# Patient Record
Sex: Female | Born: 1960
Health system: Southern US, Community
[De-identification: ages and names within clinical notes are randomized; demographics above are authoritative.]

## PROBLEM LIST (undated history)

## (undated) DIAGNOSIS — E119 Type 2 diabetes mellitus without complications: Secondary | ICD-10-CM

## (undated) DIAGNOSIS — I10 Essential (primary) hypertension: Secondary | ICD-10-CM

## (undated) DIAGNOSIS — I509 Heart failure, unspecified: Secondary | ICD-10-CM

## (undated) HISTORY — PX: CHOLECYSTECTOMY: SHX55

---

## 2016-11-08 ENCOUNTER — Inpatient Hospital Stay (HOSPITAL_COMMUNITY)
Admission: EM | Admit: 2016-11-08 | Discharge: 2016-11-13 | DRG: 291 | Disposition: A | Discharge status: Home / Self Care | Payer: Medicaid - Out of State | Attending: Internal Medicine | Admitting: Internal Medicine

## 2016-11-08 ENCOUNTER — Encounter (HOSPITAL_COMMUNITY): Payer: Self-pay | Admitting: Emergency Medicine

## 2016-11-08 ENCOUNTER — Emergency Department (HOSPITAL_COMMUNITY): Payer: Medicaid - Out of State

## 2016-11-08 DIAGNOSIS — Z7982 Long term (current) use of aspirin: Secondary | ICD-10-CM

## 2016-11-08 DIAGNOSIS — E669 Obesity, unspecified: Secondary | ICD-10-CM | POA: Diagnosis present

## 2016-11-08 DIAGNOSIS — I10 Essential (primary) hypertension: Secondary | ICD-10-CM | POA: Diagnosis present

## 2016-11-08 DIAGNOSIS — E1121 Type 2 diabetes mellitus with diabetic nephropathy: Secondary | ICD-10-CM | POA: Diagnosis present

## 2016-11-08 DIAGNOSIS — N183 Chronic kidney disease, stage 3 (moderate): Secondary | ICD-10-CM | POA: Diagnosis present

## 2016-11-08 DIAGNOSIS — I5021 Acute systolic (congestive) heart failure: Secondary | ICD-10-CM

## 2016-11-08 DIAGNOSIS — E119 Type 2 diabetes mellitus without complications: Secondary | ICD-10-CM | POA: Diagnosis present

## 2016-11-08 DIAGNOSIS — D696 Thrombocytopenia, unspecified: Secondary | ICD-10-CM | POA: Diagnosis present

## 2016-11-08 DIAGNOSIS — E785 Hyperlipidemia, unspecified: Secondary | ICD-10-CM | POA: Diagnosis present

## 2016-11-08 DIAGNOSIS — D638 Anemia in other chronic diseases classified elsewhere: Secondary | ICD-10-CM | POA: Diagnosis present

## 2016-11-08 DIAGNOSIS — D509 Iron deficiency anemia, unspecified: Secondary | ICD-10-CM | POA: Diagnosis present

## 2016-11-08 DIAGNOSIS — I130 Hypertensive heart and chronic kidney disease with heart failure and stage 1 through stage 4 chronic kidney disease, or unspecified chronic kidney disease: Principal | ICD-10-CM | POA: Diagnosis present

## 2016-11-08 DIAGNOSIS — Z794 Long term (current) use of insulin: Secondary | ICD-10-CM

## 2016-11-08 DIAGNOSIS — I5043 Acute on chronic combined systolic (congestive) and diastolic (congestive) heart failure: Secondary | ICD-10-CM | POA: Diagnosis present

## 2016-11-08 DIAGNOSIS — R0902 Hypoxemia: Secondary | ICD-10-CM

## 2016-11-08 DIAGNOSIS — E0590 Thyrotoxicosis, unspecified without thyrotoxic crisis or storm: Secondary | ICD-10-CM | POA: Diagnosis present

## 2016-11-08 DIAGNOSIS — Z6833 Body mass index (BMI) 33.0-33.9, adult: Secondary | ICD-10-CM

## 2016-11-08 DIAGNOSIS — Z8249 Family history of ischemic heart disease and other diseases of the circulatory system: Secondary | ICD-10-CM

## 2016-11-08 DIAGNOSIS — Z833 Family history of diabetes mellitus: Secondary | ICD-10-CM

## 2016-11-08 DIAGNOSIS — N179 Acute kidney failure, unspecified: Secondary | ICD-10-CM | POA: Diagnosis present

## 2016-11-08 DIAGNOSIS — D649 Anemia, unspecified: Secondary | ICD-10-CM | POA: Diagnosis present

## 2016-11-08 DIAGNOSIS — N184 Chronic kidney disease, stage 4 (severe): Secondary | ICD-10-CM | POA: Diagnosis present

## 2016-11-08 DIAGNOSIS — E1122 Type 2 diabetes mellitus with diabetic chronic kidney disease: Secondary | ICD-10-CM | POA: Diagnosis present

## 2016-11-08 DIAGNOSIS — I5023 Acute on chronic systolic (congestive) heart failure: Secondary | ICD-10-CM

## 2016-11-08 DIAGNOSIS — J9601 Acute respiratory failure with hypoxia: Secondary | ICD-10-CM | POA: Diagnosis present

## 2016-11-08 DIAGNOSIS — I248 Other forms of acute ischemic heart disease: Secondary | ICD-10-CM | POA: Diagnosis present

## 2016-11-08 HISTORY — DX: Type 2 diabetes mellitus without complications: E11.9

## 2016-11-08 HISTORY — DX: Heart failure, unspecified: I50.9

## 2016-11-08 HISTORY — DX: Essential (primary) hypertension: I10

## 2016-11-08 LAB — COMPREHENSIVE METABOLIC PANEL
ALBUMIN: 3.2 g/dL — AB (ref 3.5–5.0)
ALT: 35 U/L (ref 14–54)
ANION GAP: 3 — AB (ref 5–15)
AST: 28 U/L (ref 15–41)
Alkaline Phosphatase: 94 U/L (ref 38–126)
BILIRUBIN TOTAL: 1.1 mg/dL (ref 0.3–1.2)
BUN: 36 mg/dL — AB (ref 6–20)
CHLORIDE: 115 mmol/L — AB (ref 101–111)
CO2: 26 mmol/L (ref 22–32)
Calcium: 8.6 mg/dL — ABNORMAL LOW (ref 8.9–10.3)
Creatinine, Ser: 1.68 mg/dL — ABNORMAL HIGH (ref 0.44–1.00)
GFR calc Af Amer: 38 mL/min — ABNORMAL LOW (ref >60)
GFR calc non Af Amer: 33 mL/min — ABNORMAL LOW (ref >60)
GLUCOSE: 184 mg/dL — AB (ref 65–99)
POTASSIUM: 3.5 mmol/L (ref 3.5–5.1)
SODIUM: 144 mmol/L (ref 135–145)
Total Protein: 6.3 g/dL — ABNORMAL LOW (ref 6.5–8.1)

## 2016-11-08 LAB — CBC WITH DIFFERENTIAL/PLATELET
BASOS ABS: 0 10*3/uL (ref 0.0–0.1)
Basophils Relative: 0 %
EOS PCT: 1 %
Eosinophils Absolute: 0.1 10*3/uL (ref 0.0–0.7)
HEMATOCRIT: 26.5 % — AB (ref 36.0–46.0)
Hemoglobin: 8.7 g/dL — ABNORMAL LOW (ref 12.0–15.0)
LYMPHS ABS: 1 10*3/uL (ref 0.7–4.0)
LYMPHS PCT: 11 %
MCH: 28.4 pg (ref 26.0–34.0)
MCHC: 32.8 g/dL (ref 30.0–36.0)
MCV: 86.6 fL (ref 78.0–100.0)
MONO ABS: 0.5 10*3/uL (ref 0.1–1.0)
MONOS PCT: 6 %
NEUTROS ABS: 7.1 10*3/uL (ref 1.7–7.7)
Neutrophils Relative %: 82 %
PLATELETS: 87 10*3/uL — AB (ref 150–400)
RBC: 3.06 MIL/uL — ABNORMAL LOW (ref 3.87–5.11)
RDW: 17.8 % — AB (ref 11.5–15.5)
WBC: 8.7 10*3/uL (ref 4.0–10.5)

## 2016-11-08 LAB — I-STAT CG4 LACTIC ACID, ED: Lactic Acid, Venous: 0.91 mmol/L (ref 0.5–1.9)

## 2016-11-08 NOTE — ED Provider Notes (Signed)
TIME SEEN: 11:13 PM  CHIEF COMPLAINT: shortness of breath  HPI: patient is a 56 year old female with history of hypertension, diabetes, CHF on Lasix 40 mg twice daily was visiting her son and is from [place] who presents to the emergency department with complaints f shortness of breath for the past couple of days in bilateral lower extreme swelling. Having diffuse chest tightness. No fevers but has had dry cough. No history of PE or DVT. She denies history of asthma or COPD. She is not a smoker. She does have a nebulizer machine at home however.  ROS: See HPI Constitutional: no fever  Eyes: no drainage  ENT: no runny nose   Cardiovascular:   chest pain  Resp:  SOB  GI: no vomiting GU: no dysuria Integumentary: no rash  Allergy: no hives  Musculoskeletal: no leg swelling  Neurological: no slurred speech ROS otherwise negative  PAST MEDICAL HISTORY/PAST SURGICAL HISTORY:  Past Medical History:  Diagnosis Date  . CHF (congestive heart failure) (HCC)   . Diabetes mellitus without complication (HCC)   . Hypertension     MEDICATIONS:  Prior to Admission medications   Not on File    ALLERGIES:  No Known Allergies  SOCIAL HISTORY:  Social History  Substance Use Topics  . Smoking status: Never Smoker  . Smokeless tobacco: Never Used  . Alcohol use No    FAMILY HISTORY: History reviewed. No pertinent family history.  EXAM: BP (!) 192/100 (BP Location: Right Arm)   Pulse (!) 111   Temp 98.1 F (36.7 C) (Oral)   Resp 10   Ht 5\' 7"  (1.702 m)   Wt 97.5 kg (215 lb)   SpO2 97%   BMI 33.67 kg/m  CONSTITUTIONAL: Alert and oriented and responds appropriately to questions. Well-appearing; well-nourished HEAD: Normocephalic EYES: Conjunctivae clear, pupils appear equal, EOMI ENT: normal nose; moist mucous membranes NECK: Supple, no meningismus, no nuchal rigidity, no LAD  CARD: RRR; S1 and S2 appreciated; no murmurs, no clicks, no rubs, no gallops RESP: patient is  hypoxic on room air with sats of 87%, mild bibasilar crackles, no rhonchi or wheezing, no respiratory distress, speaking full sentences, patient is tachypneic ABD/GI: Normal bowel sounds; non-distended; soft, non-tender, no rebound, no guarding, no peritoneal signs, no hepatosplenomegaly BACK:  The back appears normal and is non-tender to palpation, there is no CVA tenderness EXT: Normal ROM in all joints; non-tender to palpation; trace pitting edema in bilateral lower extremities; normal capillary refill; no cyanosis, no calf tenderness or swelling    SKIN: Normal color for age and race; warm; no rash NEURO: Moves all extremities equally PSYCH: The patient's mood and manner are appropriate. Grooming and personal hygiene are appropriate.  MEDICAL DECISION MAKING: patient here with shortness of breath. Has history of CHF. Does appear slightly volume overloaded.we'll obtain labs. She does feel warm to touch but rectal temperature is normal.  ED PROGRESS: patient appears to have a CHF exacerbation. BNP is elevated and chest x-ray consistent with pulmonary edema. Troponin negative. We'll give 40 mg of IV Lasix for diuresis. I recommended admission for IV diuresis especially given new oxygen requirement. Will discuss with medicine. Her primary care provider is in [place].    12:25 AM Discussed patient's case with hospitalist, Dr. Adela Glimpse.  I have recommended admission and patient (and family if present) agree with this plan. Admitting physician will place admission orders.   I reviewed all nursing notes, vitals, pertinent previous records, EKGs, lab and urine results, imaging (  as available).     Mayu Ronk, Layla Maw, DO 11/09/16 0116

## 2016-11-08 NOTE — ED Triage Notes (Signed)
Pt presents by EMS from home for evaluation of shortness of breath that has been ongoing for the last 2 days. Pt reports hx of CHF and last dose of Lasix was this morning. Pt reports prior hx of intubation related to respiratory failure.

## 2016-11-08 NOTE — ED Notes (Signed)
Bed: RESA Expected date:  Expected time:  Means of arrival:  Comments: 56 yr old respiratory distress

## 2016-11-09 ENCOUNTER — Inpatient Hospital Stay (HOSPITAL_COMMUNITY): Payer: Medicaid - Out of State

## 2016-11-09 ENCOUNTER — Encounter (HOSPITAL_COMMUNITY): Payer: Self-pay | Admitting: Internal Medicine

## 2016-11-09 ENCOUNTER — Inpatient Hospital Stay (HOSPITAL_COMMUNITY)
Admit: 2016-11-09 | Discharge: 2016-11-09 | Disposition: A | Discharge status: Home / Self Care | Payer: Medicaid - Out of State | Attending: Internal Medicine | Admitting: Internal Medicine

## 2016-11-09 DIAGNOSIS — E119 Type 2 diabetes mellitus without complications: Secondary | ICD-10-CM | POA: Diagnosis present

## 2016-11-09 DIAGNOSIS — I10 Essential (primary) hypertension: Secondary | ICD-10-CM | POA: Diagnosis not present

## 2016-11-09 DIAGNOSIS — J9601 Acute respiratory failure with hypoxia: Secondary | ICD-10-CM

## 2016-11-09 DIAGNOSIS — E1121 Type 2 diabetes mellitus with diabetic nephropathy: Secondary | ICD-10-CM | POA: Diagnosis present

## 2016-11-09 DIAGNOSIS — I5031 Acute diastolic (congestive) heart failure: Secondary | ICD-10-CM | POA: Diagnosis not present

## 2016-11-09 DIAGNOSIS — N184 Chronic kidney disease, stage 4 (severe): Secondary | ICD-10-CM | POA: Diagnosis present

## 2016-11-09 DIAGNOSIS — E1122 Type 2 diabetes mellitus with diabetic chronic kidney disease: Secondary | ICD-10-CM | POA: Diagnosis present

## 2016-11-09 DIAGNOSIS — I5043 Acute on chronic combined systolic (congestive) and diastolic (congestive) heart failure: Secondary | ICD-10-CM | POA: Diagnosis present

## 2016-11-09 DIAGNOSIS — N183 Chronic kidney disease, stage 3 (moderate): Secondary | ICD-10-CM

## 2016-11-09 DIAGNOSIS — I361 Nonrheumatic tricuspid (valve) insufficiency: Secondary | ICD-10-CM | POA: Diagnosis not present

## 2016-11-09 DIAGNOSIS — I5033 Acute on chronic diastolic (congestive) heart failure: Secondary | ICD-10-CM

## 2016-11-09 DIAGNOSIS — Z794 Long term (current) use of insulin: Secondary | ICD-10-CM

## 2016-11-09 DIAGNOSIS — R609 Edema, unspecified: Secondary | ICD-10-CM

## 2016-11-09 DIAGNOSIS — N179 Acute kidney failure, unspecified: Secondary | ICD-10-CM

## 2016-11-09 DIAGNOSIS — D696 Thrombocytopenia, unspecified: Secondary | ICD-10-CM | POA: Diagnosis present

## 2016-11-09 DIAGNOSIS — Z7982 Long term (current) use of aspirin: Secondary | ICD-10-CM | POA: Diagnosis not present

## 2016-11-09 DIAGNOSIS — R0602 Shortness of breath: Secondary | ICD-10-CM | POA: Diagnosis present

## 2016-11-09 DIAGNOSIS — D638 Anemia in other chronic diseases classified elsewhere: Secondary | ICD-10-CM | POA: Diagnosis present

## 2016-11-09 DIAGNOSIS — I5023 Acute on chronic systolic (congestive) heart failure: Secondary | ICD-10-CM

## 2016-11-09 DIAGNOSIS — E669 Obesity, unspecified: Secondary | ICD-10-CM | POA: Diagnosis present

## 2016-11-09 DIAGNOSIS — I130 Hypertensive heart and chronic kidney disease with heart failure and stage 1 through stage 4 chronic kidney disease, or unspecified chronic kidney disease: Secondary | ICD-10-CM | POA: Diagnosis present

## 2016-11-09 DIAGNOSIS — Z6833 Body mass index (BMI) 33.0-33.9, adult: Secondary | ICD-10-CM | POA: Diagnosis not present

## 2016-11-09 DIAGNOSIS — E785 Hyperlipidemia, unspecified: Secondary | ICD-10-CM | POA: Diagnosis present

## 2016-11-09 DIAGNOSIS — E0590 Thyrotoxicosis, unspecified without thyrotoxic crisis or storm: Secondary | ICD-10-CM | POA: Diagnosis present

## 2016-11-09 DIAGNOSIS — D509 Iron deficiency anemia, unspecified: Secondary | ICD-10-CM | POA: Diagnosis present

## 2016-11-09 DIAGNOSIS — Z8249 Family history of ischemic heart disease and other diseases of the circulatory system: Secondary | ICD-10-CM | POA: Diagnosis not present

## 2016-11-09 DIAGNOSIS — D649 Anemia, unspecified: Secondary | ICD-10-CM | POA: Diagnosis not present

## 2016-11-09 DIAGNOSIS — Z833 Family history of diabetes mellitus: Secondary | ICD-10-CM | POA: Diagnosis not present

## 2016-11-09 DIAGNOSIS — D500 Iron deficiency anemia secondary to blood loss (chronic): Secondary | ICD-10-CM | POA: Diagnosis not present

## 2016-11-09 DIAGNOSIS — I248 Other forms of acute ischemic heart disease: Secondary | ICD-10-CM | POA: Diagnosis present

## 2016-11-09 LAB — TROPONIN I
TROPONIN I: 0.05 ng/mL — AB (ref <0.03)
TROPONIN I: 0.04 ng/mL — AB (ref <0.03)
Troponin I: 0.04 ng/mL (ref <0.03)

## 2016-11-09 LAB — COMPREHENSIVE METABOLIC PANEL
ALBUMIN: 3.3 g/dL — AB (ref 3.5–5.0)
ALK PHOS: 91 U/L (ref 38–126)
ALT: 32 U/L (ref 14–54)
ANION GAP: 6 (ref 5–15)
AST: 20 U/L (ref 15–41)
BUN: 36 mg/dL — ABNORMAL HIGH (ref 6–20)
CALCIUM: 8.8 mg/dL — AB (ref 8.9–10.3)
CHLORIDE: 113 mmol/L — AB (ref 101–111)
CO2: 26 mmol/L (ref 22–32)
Creatinine, Ser: 1.68 mg/dL — ABNORMAL HIGH (ref 0.44–1.00)
GFR calc non Af Amer: 33 mL/min — ABNORMAL LOW (ref >60)
GFR, EST AFRICAN AMERICAN: 38 mL/min — AB (ref >60)
GLUCOSE: 136 mg/dL — AB (ref 65–99)
Potassium: 3.7 mmol/L (ref 3.5–5.1)
SODIUM: 145 mmol/L (ref 135–145)
Total Bilirubin: 1 mg/dL (ref 0.3–1.2)
Total Protein: 6.2 g/dL — ABNORMAL LOW (ref 6.5–8.1)

## 2016-11-09 LAB — URINALYSIS, ROUTINE W REFLEX MICROSCOPIC
Bacteria, UA: NONE SEEN
Bilirubin Urine: NEGATIVE
Glucose, UA: NEGATIVE mg/dL
KETONES UR: NEGATIVE mg/dL
Nitrite: NEGATIVE
PH: 5.5 (ref 5.0–8.0)
Protein, ur: 300 mg/dL — AB
Specific Gravity, Urine: 1.02 (ref 1.005–1.030)

## 2016-11-09 LAB — CBC
HCT: 25.8 % — ABNORMAL LOW (ref 36.0–46.0)
HEMOGLOBIN: 8.4 g/dL — AB (ref 12.0–15.0)
MCH: 28.3 pg (ref 26.0–34.0)
MCHC: 32.6 g/dL (ref 30.0–36.0)
MCV: 86.9 fL (ref 78.0–100.0)
Platelets: 97 10*3/uL — ABNORMAL LOW (ref 150–400)
RBC: 2.97 MIL/uL — AB (ref 3.87–5.11)
RDW: 17.8 % — ABNORMAL HIGH (ref 11.5–15.5)
WBC: 8.5 10*3/uL (ref 4.0–10.5)

## 2016-11-09 LAB — D-DIMER, QUANTITATIVE (NOT AT ARMC): D DIMER QUANT: 2.99 ug{FEU}/mL — AB (ref 0.00–0.50)

## 2016-11-09 LAB — GLUCOSE, CAPILLARY
GLUCOSE-CAPILLARY: 126 mg/dL — AB (ref 65–99)
GLUCOSE-CAPILLARY: 175 mg/dL — AB (ref 65–99)

## 2016-11-09 LAB — IRON AND TIBC
IRON: 25 ug/dL — AB (ref 28–170)
Saturation Ratios: 12 % (ref 10.4–31.8)
TIBC: 211 ug/dL — AB (ref 250–450)
UIBC: 186 ug/dL

## 2016-11-09 LAB — CREATININE, URINE, RANDOM: CREATININE, URINE: 105.96 mg/dL

## 2016-11-09 LAB — BRAIN NATRIURETIC PEPTIDE: B Natriuretic Peptide: 814 pg/mL — ABNORMAL HIGH (ref 0.0–100.0)

## 2016-11-09 LAB — TSH: TSH: 0.019 u[IU]/mL — AB (ref 0.350–4.500)

## 2016-11-09 LAB — CBG MONITORING, ED
Glucose-Capillary: 131 mg/dL — ABNORMAL HIGH (ref 65–99)
Glucose-Capillary: 130 mg/dL — ABNORMAL HIGH (ref 65–99)

## 2016-11-09 LAB — MAGNESIUM: MAGNESIUM: 1.8 mg/dL (ref 1.7–2.4)

## 2016-11-09 LAB — FERRITIN: Ferritin: 97 ng/mL (ref 11–307)

## 2016-11-09 LAB — SODIUM, URINE, RANDOM: SODIUM UR: 68 mmol/L

## 2016-11-09 LAB — T4, FREE: FREE T4: 1.24 ng/dL — AB (ref 0.61–1.12)

## 2016-11-09 LAB — RETICULOCYTES
RBC.: 2.97 MIL/uL — ABNORMAL LOW (ref 3.87–5.11)
RETIC COUNT ABSOLUTE: 83.2 10*3/uL (ref 19.0–186.0)
RETIC CT PCT: 2.8 % (ref 0.4–3.1)

## 2016-11-09 LAB — I-STAT TROPONIN, ED: TROPONIN I, POC: 0.04 ng/mL (ref 0.00–0.08)

## 2016-11-09 LAB — HIV ANTIBODY (ROUTINE TESTING W REFLEX): HIV Screen 4th Generation wRfx: NONREACTIVE

## 2016-11-09 LAB — VITAMIN B12: VITAMIN B 12: 653 pg/mL (ref 180–914)

## 2016-11-09 LAB — PHOSPHORUS: Phosphorus: 2.8 mg/dL (ref 2.5–4.6)

## 2016-11-09 LAB — FOLATE: Folate: 15.8 ng/mL (ref >5.9)

## 2016-11-09 MED ORDER — TECHNETIUM TC 99M DIETHYLENETRIAME-PENTAACETIC ACID
32.6000 | Freq: Once | INTRAVENOUS | Status: AC | PRN
  Administered 2016-11-09: 32.6 via INTRAVENOUS

## 2016-11-09 MED ORDER — PERFLUTREN LIPID MICROSPHERE
1.0000 mL | INTRAVENOUS | Status: AC | PRN
  Administered 2016-11-09: 3 mL via INTRAVENOUS
  Filled 2016-11-09: qty 10

## 2016-11-09 MED ORDER — ACETAMINOPHEN 325 MG PO TABS: 650.0000 mg | ORAL_TABLET | Freq: Four times a day (QID) | ORAL | Status: DC | PRN

## 2016-11-09 MED ORDER — ISOSORB DINITRATE-HYDRALAZINE 20-37.5 MG PO TABS
1.0000 | ORAL_TABLET | Freq: Three times a day (TID) | ORAL | Status: DC
  Administered 2016-11-09 – 2016-11-13 (×12): 1 via ORAL
  Filled 2016-11-09 (×13): qty 1

## 2016-11-09 MED ORDER — CARVEDILOL 25 MG PO TABS
50.0000 mg | ORAL_TABLET | Freq: Two times a day (BID) | ORAL | Status: DC
  Administered 2016-11-09 – 2016-11-13 (×9): 50 mg via ORAL
  Filled 2016-11-09 (×10): qty 2

## 2016-11-09 MED ORDER — PERFLUTREN LIPID MICROSPHERE
INTRAVENOUS | Status: AC
  Filled 2016-11-09: qty 10

## 2016-11-09 MED ORDER — SODIUM CHLORIDE 0.9% FLUSH
3.0000 mL | Freq: Two times a day (BID) | INTRAVENOUS | Status: DC
  Administered 2016-11-09 – 2016-11-13 (×9): 3 mL via INTRAVENOUS

## 2016-11-09 MED ORDER — ATORVASTATIN CALCIUM 40 MG PO TABS
40.0000 mg | ORAL_TABLET | Freq: Every day | ORAL | Status: DC
  Administered 2016-11-09 – 2016-11-13 (×5): 40 mg via ORAL
  Filled 2016-11-09 (×5): qty 1

## 2016-11-09 MED ORDER — ACETAMINOPHEN 650 MG RE SUPP: 650.0000 mg | Freq: Four times a day (QID) | RECTAL | Status: DC | PRN

## 2016-11-09 MED ORDER — LEVOTHYROXINE SODIUM 25 MCG PO TABS
25.0000 ug | ORAL_TABLET | Freq: Every day | ORAL | Status: DC
  Administered 2016-11-10: 25 ug via ORAL
  Filled 2016-11-09: qty 1

## 2016-11-09 MED ORDER — FUROSEMIDE 10 MG/ML IJ SOLN
40.0000 mg | Freq: Two times a day (BID) | INTRAMUSCULAR | Status: DC
  Administered 2016-11-10 – 2016-11-11 (×3): 40 mg via INTRAVENOUS
  Filled 2016-11-09 (×3): qty 4

## 2016-11-09 MED ORDER — SODIUM CHLORIDE 0.9 % IV SOLN: 250.0000 mL | INTRAVENOUS | Status: DC | PRN

## 2016-11-09 MED ORDER — INSULIN ASPART 100 UNIT/ML ~~LOC~~ SOLN
0.0000 [IU] | Freq: Three times a day (TID) | SUBCUTANEOUS | Status: DC
  Administered 2016-11-09 – 2016-11-10 (×4): 1 [IU] via SUBCUTANEOUS
  Administered 2016-11-11 (×2): 2 [IU] via SUBCUTANEOUS
  Administered 2016-11-12 (×3): 1 [IU] via SUBCUTANEOUS
  Administered 2016-11-13: 3 [IU] via SUBCUTANEOUS

## 2016-11-09 MED ORDER — SODIUM CHLORIDE 0.9% FLUSH: 3.0000 mL | INTRAVENOUS | Status: DC | PRN

## 2016-11-09 MED ORDER — INSULIN GLARGINE 100 UNIT/ML ~~LOC~~ SOLN
25.0000 [IU] | Freq: Every day | SUBCUTANEOUS | Status: DC
  Administered 2016-11-09 – 2016-11-12 (×4): 25 [IU] via SUBCUTANEOUS
  Filled 2016-11-09 (×5): qty 0.25

## 2016-11-09 MED ORDER — TECHNETIUM TO 99M ALBUMIN AGGREGATED
4.2000 | Freq: Once | INTRAVENOUS | Status: AC | PRN
  Administered 2016-11-09: 4.2 via INTRAVENOUS

## 2016-11-09 MED ORDER — ALLOPURINOL 100 MG PO TABS
100.0000 mg | ORAL_TABLET | Freq: Every day | ORAL | Status: DC
  Administered 2016-11-09 – 2016-11-13 (×5): 100 mg via ORAL
  Filled 2016-11-09 (×5): qty 1

## 2016-11-09 MED ORDER — ONDANSETRON HCL 4 MG PO TABS: 4.0000 mg | ORAL_TABLET | Freq: Four times a day (QID) | ORAL | Status: DC | PRN

## 2016-11-09 MED ORDER — FERROUS SULFATE 325 (65 FE) MG PO TABS
325.0000 mg | ORAL_TABLET | Freq: Two times a day (BID) | ORAL | Status: DC
  Administered 2016-11-09 – 2016-11-13 (×8): 325 mg via ORAL
  Filled 2016-11-09 (×8): qty 1

## 2016-11-09 MED ORDER — MOMETASONE FURO-FORMOTEROL FUM 200-5 MCG/ACT IN AERO
2.0000 | INHALATION_SPRAY | Freq: Two times a day (BID) | RESPIRATORY_TRACT | Status: DC
  Administered 2016-11-09 – 2016-11-13 (×8): 2 via RESPIRATORY_TRACT
  Filled 2016-11-09: qty 8.8

## 2016-11-09 MED ORDER — INSULIN ASPART 100 UNIT/ML ~~LOC~~ SOLN
0.0000 [IU] | Freq: Every day | SUBCUTANEOUS | Status: DC
  Administered 2016-11-10: 2 [IU] via SUBCUTANEOUS

## 2016-11-09 MED ORDER — FUROSEMIDE 10 MG/ML IJ SOLN
40.0000 mg | Freq: Once | INTRAMUSCULAR | Status: AC
  Administered 2016-11-09: 40 mg via INTRAVENOUS
  Filled 2016-11-09: qty 4

## 2016-11-09 MED ORDER — ONDANSETRON HCL 4 MG/2ML IJ SOLN: 4.0000 mg | Freq: Four times a day (QID) | INTRAMUSCULAR | Status: DC | PRN

## 2016-11-09 NOTE — Consult Note (Signed)
Cardiology Consultation:   Patient ID: Barbara Velazquez; [id number]; 11/17/1960   Admit date: 11/08/2016 Date of Consult: 11/09/2016  Primary Care Provider: System, Pcp Not In Primary Cardiologist: NEW  Dr. Eden Emms on [facility] Primary Electrophysiologist:  NA   Patient Profile:   Barbara Velazquez is a 56 y.o. female with a hx of CHF, with hx of intubation for respiratory failure, DM-2, HTN who is being seen today for the evaluation of CHF at the request of Dr. Edward Jolly.  History of Present Illness:   Barbara Velazquez  a hx of CHF, with hx of intubation for respiratory failure, DM-2, HTN treated in [place] with recent intubation in March of this year.  Also anemic then.    Was admitted early AM today for acute SOB Dx of acute respiratory failure with hypoxia.  DDimer was elevated and VQ has been ordered.    CXR with cardiomegaly with sm. Posterior pl. Effusions and diffuse airspace disease. CHF.  EKG ST at 104 Q waves in V1-2 and V5-6 and lead I with T wave inversions, no old to compare. I personally reviewed TELE SR  BNP 814  Today Na 145, K+3.7, Cl 113, BUN 36 and Cr 1.68 similar to admit.   WBC 8.5, Hgb 8.4, platelets 97 again similar to admit  Troponin 0.04 and 0.05 DDimer 2.99 awaiting VQ,  TSH 0.019   Iron at 25   BP on admit  184/88, 191/101, now 185/94  Respiration still elevated at 25.   Echo pending Was on lasix 40 mg po daily, hydralazine, and statin, ASA.and ARB and BB.   Currently feeling better, just back from VQ scan.  She had some chest pressure with her SOB.   She tells me she had cardiac cath 5-6 years ago and everything was fine no blockages.   She has had HF for a long time and believes it is diastolic HF.  When she was in the hospital in March/April in [facility] they discussed cardiac cath but decided not to do.  She had been doing well until last 3 days or so.  She is living with her son in [place] and he and his wife have been following a diet for her.  But she  had increased SOB and some mild swelling in her feet last Pm and SOB was worse.   She has received IV lasix and has voided more freq.    Neg doppler for DVT.    Past Medical History:  Diagnosis Date  . CHF (congestive heart failure) (HCC)   . Diabetes mellitus without complication (HCC)   . Hypertension     Past Surgical History:  Procedure Laterality Date  . CHOLECYSTECTOMY       Home Medications:  Prior to Admission medications   Medication Sig Start Date End Date Taking? Authorizing Provider  ADVAIR DISKUS 250-50 MCG/DOSE AEPB Inhale 1 puff into the lungs 2 (two) times daily as needed (SOB).  08/24/16  Yes [provider]  albuterol (PROVENTIL HFA;VENTOLIN HFA) 108 (90 Base) MCG/ACT inhaler Inhale 2 puffs into the lungs every 4 (four) hours as needed for wheezing or shortness of breath.   Yes [provider]  allopurinol (ZYLOPRIM) 100 MG tablet Take 100 mg by mouth daily.   Yes [provider]  aspirin 81 MG chewable tablet Chew 81 mg by mouth daily.   Yes [provider]  atorvastatin (LIPITOR) 40 MG tablet Take 40 mg by mouth daily.   Yes [provider]  carvedilol (COREG) 25 MG tablet Take 50 mg by mouth 2 (two) times daily with a meal.   Yes [provider]  cholecalciferol (VITAMIN D) 1000 units tablet Take 1,000 Units by mouth daily.   Yes [provider]  ferrous sulfate 325 (65 FE) MG EC tablet Take 325 mg by mouth 2 (two) times daily.   Yes [provider]  furosemide (LASIX) 40 MG tablet Take 40 mg by mouth daily.   Yes [provider]  Insulin Glargine (BASAGLAR KWIKPEN) 100 UNIT/ML SOPN Inject 25 Units into the skin at bedtime.   Yes [provider]  isosorbide-hydrALAZINE (BIDIL) 20-37.5 MG tablet Take 1 tablet by mouth 3 (three) times daily.   Yes [provider]  levothyroxine (SYNTHROID, LEVOTHROID) 25 MCG tablet Take 25 mcg by mouth daily before breakfast.   Yes  [provider]  liraglutide (VICTOZA) 18 MG/3ML SOPN Inject 1.8 mg into the skin daily.   Yes [provider]  losartan (COZAAR) 25 MG tablet Take 25 mg by mouth daily.   Yes [provider]    Inpatient Medications: Scheduled Meds: . allopurinol  100 mg Oral Daily  . atorvastatin  40 mg Oral Daily  . carvedilol  50 mg Oral BID WC  . furosemide  40 mg Intravenous Q12H  . insulin aspart  0-5 Units Subcutaneous QHS  . insulin aspart  0-9 Units Subcutaneous TID WC  . insulin glargine  25 Units Subcutaneous QHS  . isosorbide-hydrALAZINE  1 tablet Oral TID  . levothyroxine  25 mcg Oral QAC breakfast  . mometasone-formoterol  2 puff Inhalation BID  . sodium chloride flush  3 mL Intravenous Q12H   Continuous Infusions: . sodium chloride     PRN Meds: sodium chloride, acetaminophen **OR** acetaminophen, ondansetron **OR** ondansetron (ZOFRAN) IV, sodium chloride flush  Allergies:   No Known Allergies  Social History:   Social History   Social History  . Marital status: Divorced    Spouse name: N/A  . Number of children: N/A  . Years of education: N/A   Occupational History  . Not on file.   Social History Main Topics  . Smoking status: Never Smoker  . Smokeless tobacco: Never Used  . Alcohol use No  . Drug use: No  . Sexual activity: Not on file   Other Topics Concern  . Not on file   Social History Narrative  . No narrative on file    Family History:    Family History  Problem Relation Age of Onset  . Diabetes Mother   . Hypertension Mother   . Breast cancer Mother   . Diabetes Father   . Hypertension Father   . Pancreatic cancer Father   . Stroke Neg Hx      ROS:  Please see the history of present illness.  ROS  General:no colds or fevers, no weight changes Skin:no rashes or ulcers HEENT:no blurred vision, no congestion CV:see HPI PUL:see HPI GI:no diarrhea constipation or melena, no indigestion GU:no hematuria, no  dysuria MS:no joint pain, no claudication Neuro:no syncope, no lightheadedness Endo:+ diabetes, no thyroid disease    Physical Exam/Data:   Vitals:   11/09/16 0200 11/09/16 0400 11/09/16 0515 11/09/16 0600  BP: (!) 166/73 (!) 191/101 (!) 186/92 (!) 188/94  Pulse: 96 97 99 95  Resp: (!) 22 (!) 21 (!) 28 (!) 29  Temp:      TempSrc:      SpO2: (!) 86% 99% 99% 98%  Weight:      Height:       No intake or output data in the 24 hours ending 11/09/16 1118 Filed Weights   11/08/16 2229  Weight: 215 lb (97.5 kg)   Body mass index is 33.67 kg/m.  General:  Well nourished, well developed, in no acute distress though dyspneic with exertion HEENT: normal Lymph: no adenopathy Neck: no JVD Endocrine:  No thryomegaly Vascular: No carotid bruits;2+ bil. Pedal pulses bil Cardiac:  normal S1, S2; RRR; no murmur gallup rub or click Lungs:  Bilateral breath sounds to auscultation bilaterally, occ wheezing, no rhonchi + rales in bases Abd: obese, soft, nontender, no hepatomegaly  Ext: no edema Musculoskeletal:  No deformities, BUE and BLE strength normal and equal Skin: warm and dry  Neuro:  Alert and oriented X 3 MAE follows commands + facial symmetry Psych:  Normal affect    Relevant CV Studies: Echo pending  Laboratory Data:  Chemistry  Recent Labs Lab 11/08/16 2247 11/09/16 0512  NA 144 145  K 3.5 3.7  CL 115* 113*  CO2 26 26  GLUCOSE 184* 136*  BUN 36* 36*  CREATININE 1.68* 1.68*  CALCIUM 8.6* 8.8*  GFRNONAA 33* 33*  GFRAA 38* 38*  ANIONGAP 3* 6     Recent Labs Lab 11/08/16 2247 11/09/16 0512  PROT 6.3* 6.2*  ALBUMIN 3.2* 3.3*  AST 28 20  ALT 35 32  ALKPHOS 94 91  BILITOT 1.1 1.0   Hematology  Recent Labs Lab 11/08/16 2247 11/09/16 0512  WBC 8.7 8.5  RBC 3.06* 2.97*  2.97*  HGB 8.7* 8.4*  HCT 26.5* 25.8*  MCV 86.6 86.9  MCH 28.4 28.3  MCHC 32.8 32.6  RDW 17.8* 17.8*  PLT 87* 97*   Cardiac Enzymes  Recent Labs Lab 11/09/16 0337    TROPONINI 0.05*     Recent Labs Lab 11/08/16 2354  TROPIPOC 0.04    BNP  Recent Labs Lab 11/08/16 2346  BNP 814.0*    DDimer   Recent Labs Lab 11/08/16 2247  DDIMER 2.99*    Radiology/Studies:  Dg Chest 2 View  Result Date: 11/08/2016 CLINICAL DATA:  CHF, dyspnea. EXAM: CHEST  2 VIEW COMPARISON:  None. FINDINGS: Cardiomegaly. Diffuse bilateral airspace opacities with small posterior pleural effusions or consistent with CHF. Superimposed pneumonia is not entirely excluded but believed less likely. No acute osseous abnormality. IMPRESSION: Cardiomegaly with small posterior pleural effusions and diffuse airspace disease consistent with CHF. Electronically Signed   By: Tollie Eth M.D.   On: 11/08/2016 23:55   Nm Pulmonary Perf And Vent  Result Date: 11/09/2016 CLINICAL DATA:  Shortness of breath.  Chest pain. EXAM: NUCLEAR MEDICINE VENTILATION - PERFUSION LUNG SCAN TECHNIQUE: Ventilation images were obtained in multiple projections using inhaled aerosol Tc-28m DTPA. Perfusion images were obtained in multiple projections after intravenous injection of Tc-36m MAA. RADIOPHARMACEUTICALS:  32.6 mCi Technetium-43m DTPA aerosol inhalation and 4.2 mCi Technetium-55m MAA IV COMPARISON:  Chest x-ray 10/01/2016. FINDINGS: Multifocal bilateral patchy ventilation defects. No significant ventilation defects most likely secondary to bilateral airspace disease previously noted. Tiny perfusion defects present are much smaller than ventilation defects. This is a low probability for pulmonary embolus study. IMPRESSION: Low probability pulmonary embolus. Electronically Signed   By: Maisie Fus  Register   On: 11/09/2016 10:54    Assessment and Plan:    Acute CHF awaiting echo she believes she has diastolic HF,  But not 100% sure.  Improved.   On good outpt meds  On last episode they contemplated cardiac cath but decided not to do.  Anemia could be playing a role in her HF as well.   Dr. Eden Emms to see today   OK to eat.  ---VQ scan neg for PE  Remote cath 5-6 years ago and no need for stents. Mild elevation in troponin.  May need cath depending on Echo.   Anemia and iron deficiency per IM  Thrombocytopenia -new per pt.  HTN elevated today not yet had her meds.  DM-2 per IM and SSI  Hyperthyroid with TSH 0.019 this too may be playing a role.  Per Im   Signed, Nada Boozer, NP  11/09/2016 11:18 AM   Patient examined chart reviewed Back from V/Q which is negative Exam with obese black female. Basilar atelectasis no murmur trace LE edema. Doubt systolic CHF. Suspect hyperthyroidism, poorly controlled HTN and anemia contribute most Continue home dose lasix and coreg increase ARB to 50 mg daily If echo normal see no need for cath or ischemic evaluation at this time.  Discussed with patient. She is also still not clear where she will be living Visiting son from [facility] now Will need a primary care doctor f/u on d/c   Charlton Haws

## 2016-11-09 NOTE — Progress Notes (Signed)
**  Preliminary report by tech**  Bilateral lower extremity venous duplex completed. There is no evidence of deep or superficial vein thrombosis involving the right and left lower extremities. All visualized vessels appear patent and compressible. There is no evidence of Baker's cysts bilaterally.  11/09/16 9:32 AM Olen Cordial RVT

## 2016-11-09 NOTE — ED Notes (Signed)
Dr.Doutova at bedside to evaluate pt.  

## 2016-11-09 NOTE — Progress Notes (Signed)
  Echocardiogram 2D Echocardiogram with definity has been performed.  Leta Jungling M 11/09/2016, 3:25 PM

## 2016-11-09 NOTE — ED Notes (Signed)
Barbara Velazquez receiving [employer] fund[employer]. Report scheduled for 1315. [phone]

## 2016-11-09 NOTE — Progress Notes (Signed)
CPAP set up for pt., humidifier filled and placed oxygen line into unit along with M/FFM, notified RT to stop back around midnite or she would notify staff when ready, RT to monitor.

## 2016-11-09 NOTE — H&P (Signed)
Barbara Velazquez ZOX:[id number] DOB: 16-Jul-1960 DOA: 11/08/2016     PCP: Leona Carry Outpatient Specialists: Cardiology [facility] Patient coming from:   home Lives alone,    With family    Chief Complaint: Shortness of breath  HPI: Barbara Velazquez is a 56 y.o. female with medical history significant of CHF, hx of intubation for respiratory failure, DM 2, HTN    Presented with Shortness of breath was initially found hypoxic down to mid 80's.  Originally from [facility] recently admitted in [facility] with respiratory failure in March 2018 had to be intubated.  Reports needing blood transfusion at that time.  Denies melena no blood in stool. NO chest pain but occasional chest tightness.   She has not been checking her weight.  She has been taking her medications religiously. Denies salty foods.  She reports some leg swelling bilaterally. No abdominal swelling.  Today she had dry cough and worsening shortness of breath. Denies easy bleeding or bruising reports diarrhea last week but currently resolved.   Regarding pertinent Chronic problems: HX of CHF treated in [place].  DM 2 on insulin Reports Colonoscopy 3 years ago showing polyps.    IN ER:  Temp (24hrs), Avg:98.5 F (36.9 C), Min:98.1 F (36.7 C), Max:98.9 F (37.2 C)      on arrival  ED Triage Vitals  Enc Vitals Group     BP 11/08/16 2218 (!) 192/100     Pulse Rate 11/08/16 2218 (!) 111     Resp 11/08/16 2218 10     Temp 11/08/16 2218 98.1 F (36.7 C)     Temp Source 11/08/16 2218 Oral     SpO2 11/08/16 2218 94 %     Weight 11/08/16 2229 215 lb (97.5 kg)     Height 11/08/16 2229 5\' 7"  (1.702 m)     Head Circumference --      Peak Flow --      Pain Score --      Pain Loc --      Pain Edu? --      Excl. in GC? --     Latest RR 15 94% HR 100 BP 185/87 Trop 0.04 BNP 814 Lactic acid 0.91 Na 144 K 3.5 BUN 36 Cr 1.68 protein 6.3 alb 3.2 WBC 8.7 Hg 8.7 PLT 87 Lactic acid 0.91  CXR showing CHF  Following Medications were ordered  in ER: Medications  furosemide (LASIX) injection 40 mg (not administered)      Hospitalist was called for admission for CHF exacerbation  Review of Systems:    Pertinent positives include:  fatigue, shortness of breath at rest, dyspnea on exertion,  Constitutional:  No weight loss, night sweats, Fevers, chills,weight loss  HEENT:  No headaches, Difficulty swallowing,Tooth/dental problems,Sore throat,  No sneezing, itching, ear ache, nasal congestion, post nasal drip,  Cardio-vascular:  No chest pain, Orthopnea, PND, anasarca, dizziness, palpitations.no Bilateral lower extremity swelling  GI:  No heartburn, indigestion, abdominal pain, nausea, vomiting, diarrhea, change in bowel habits, loss of appetite, melena, blood in stool, hematemesis Resp:    No excess mucus, no productive cough, No non-productive cough, No coughing up of blood.No change in color of mucus.No wheezing. Skin:  no rash or lesions. No jaundice GU:  no dysuria, change in color of urine, no urgency or frequency. No straining to urinate.  No flank pain.  Musculoskeletal:  No joint pain or no joint swelling. No decreased range of motion. No back pain.  Psych:  No change in  mood or affect. No depression or anxiety. No memory loss.  Neuro: no localizing neurological complaints, no tingling, no weakness, no double vision, no gait abnormality, no slurred speech, no confusion  As per HPI otherwise 10 point review of systems negative.   Past Medical History: Past Medical History:  Diagnosis Date  . CHF (congestive heart failure) (HCC)   . Diabetes mellitus without complication (HCC)   . Hypertension    Past Surgical History:  Procedure Laterality Date  . CHOLECYSTECTOMY       Social History:  Ambulatory  independently     reports that she has never smoked. She has never used smokeless tobacco. She reports that she does not drink alcohol or use drugs.  Allergies:  No Known Allergies  Prior to Admission  medications   Medication Sig Start Date End Date Taking? Authorizing Provider  ADVAIR DISKUS 250-50 MCG/DOSE AEPB Inhale 1 puff into the lungs 2 (two) times daily as needed (SOB).  08/24/16  Yes [provider]  albuterol (PROVENTIL HFA;VENTOLIN HFA) 108 (90 Base) MCG/ACT inhaler Inhale 2 puffs into the lungs every 4 (four) hours as needed for wheezing or shortness of breath.   Yes [provider]  allopurinol (ZYLOPRIM) 100 MG tablet Take 100 mg by mouth daily.   Yes [provider]  aspirin 81 MG chewable tablet Chew 81 mg by mouth daily.   Yes [provider]  atorvastatin (LIPITOR) 40 MG tablet Take 40 mg by mouth daily.   Yes [provider]  carvedilol (COREG) 25 MG tablet Take 50 mg by mouth 2 (two) times daily with a meal.   Yes [provider]  cholecalciferol (VITAMIN D) 1000 units tablet Take 1,000 Units by mouth daily.   Yes [provider]  ferrous sulfate 325 (65 FE) MG EC tablet Take 325 mg by mouth 2 (two) times daily.   Yes [provider]  furosemide (LASIX) 40 MG tablet Take 40 mg by mouth daily.   Yes [provider]  Insulin Glargine (BASAGLAR KWIKPEN) 100 UNIT/ML SOPN Inject 25 Units into the skin at bedtime.   Yes [provider]  isosorbide-hydrALAZINE (BIDIL) 20-37.5 MG tablet Take 1 tablet by mouth 3 (three) times daily.   Yes [provider]  levothyroxine (SYNTHROID, LEVOTHROID) 25 MCG tablet Take 25 mcg by mouth daily before breakfast.   Yes [provider]  liraglutide (VICTOZA) 18 MG/3ML SOPN Inject 1.8 mg into the skin daily.   Yes [provider]  losartan (COZAAR) 25 MG tablet Take 25 mg by mouth daily.   Yes [provider]      Family History:   Family History  Problem Relation Age of Onset  . Diabetes Mother   . Hypertension Mother   . Breast cancer Mother   . Diabetes Father   . Hypertension Father   . Pancreatic cancer Father    . Stroke Neg Hx       Physical Exam: Patient Vitals for the past 24 hrs:  BP Temp Temp src Pulse Resp SpO2 Height Weight  11/09/16 0000 (!) 185/87 - - 100 15 94 % - -  11/08/16 2330 (!) 195/82 - - (!) 102 (!) 30 98 % - -  11/08/16 2319 (!) 184/88 98.9 F (37.2 C) Rectal (!) 112 (!) 21 94 % - -  11/08/16 2300 (!) 184/88 - - (!) 105 (!) 29 91 % - -  11/08/16 2230 (!) 191/92 - - (!) 103 (!) 32 93 % - -  11/08/16 2229 - - - - - 97 % 5\' 7"  (1.702 m) 97.5 kg (215 lb)  11/08/16 2218 (!) 192/100 98.1 F (36.7 C) Oral (!) 111 10 94 % - -    1. General:  in No Acute distress   Chronically ill -appearing 2. Psychological: Alert and  Oriented 3. Head/ENT:   Moist   Mucous Membranes                          Head Non traumatic, neck supple                           Poor Dentition 4. SKIN: normal   Skin turgor,  Skin clean Dry and intact no rash 5. Heart: Regular rate and rhythm no  Murmur, no Rub or gallop 6. Lungs:   no wheezes mild  crackles  Distant  breathsounds 7. Abdomen: Soft,  non-tender, Non distended   obese   8. Lower extremities: no clubbing, cyanosis, or edema 9. Neurologically Grossly intact, moving all 4 extremities equally   10. MSK: Normal range of motion   body mass index is 33.67 kg/m.  Labs on Admission:   Labs on Admission: I have personally reviewed following labs and imaging studies  CBC:  Recent Labs Lab 11/08/16 2247  WBC 8.7  NEUTROABS 7.1  HGB 8.7*  HCT 26.5*  MCV 86.6  PLT 87*   Basic Metabolic Panel:  Recent Labs Lab 11/08/16 2247  NA 144  K 3.5  CL 115*  CO2 26  GLUCOSE 184*  BUN 36*  CREATININE 1.68*  CALCIUM 8.6*   GFR: Estimated Creatinine Clearance: 44.3 mL/min (A) (by C-G formula based on SCr of 1.68 mg/dL (H)). Liver Function Tests:  Recent Labs Lab 11/08/16 2247  AST 28  ALT 35  ALKPHOS 94  BILITOT 1.1  PROT 6.3*  ALBUMIN 3.2*   No results for input(s): LIPASE, AMYLASE in the last 168 hours. No results for  input(s): AMMONIA in the last 168 hours. Coagulation Profile: No results for input(s): INR, PROTIME in the last 168 hours. Cardiac Enzymes: No results for input(s): CKTOTAL, CKMB, CKMBINDEX, TROPONINI in the last 168 hours. BNP (last 3 results) No results for input(s): PROBNP in the last 8760 hours. HbA1C: No results for input(s): HGBA1C in the last 72 hours. CBG: No results for input(s): GLUCAP in the last 168 hours. Lipid Profile: No results for input(s): CHOL, HDL, LDLCALC, TRIG, CHOLHDL, LDLDIRECT in the last 72 hours. Thyroid Function Tests: No results for input(s): TSH, T4TOTAL, FREET4, T3FREE, THYROIDAB in the last 72 hours. Anemia Panel: No results for input(s): VITAMINB12, FOLATE, FERRITIN, TIBC, IRON, RETICCTPCT in the last 72 hours. Urine analysis: No results found for: COLORURINE, APPEARANCEUR, LABSPEC, PHURINE, GLUCOSEU, HGBUR, BILIRUBINUR, KETONESUR, PROTEINUR, UROBILINOGEN, NITRITE, LEUKOCYTESUR Sepsis Labs: @LABRCNTIP (procalcitonin:4,lacticidven:4) )No results found for this or any previous visit (from the past 240 hour(s)).     UA   ordered  No results found for: HGBA1C  Estimated Creatinine Clearance: 44.3 mL/min (A) (by C-G formula based on SCr of 1.68 mg/dL (H)).  BNP (last 3 results) No results for input(s): PROBNP in the last 8760 hours.   ECG REPORT  Independently reviewed Rate: 104  Rhythm: NSR ST&T Change: No acute ischemic changes   QTC 458  Filed Weights   11/08/16 2229  Weight: 97.5 kg (215 lb)     Cultures: No results found for: SDES, SPECREQUEST, CULT,  REPTSTATUS   Radiological Exams on Admission: Dg Chest 2 View  Result Date: 11/08/2016 CLINICAL DATA:  CHF, dyspnea. EXAM: CHEST  2 VIEW COMPARISON:  None. FINDINGS: Cardiomegaly. Diffuse bilateral airspace opacities with small posterior pleural effusions or consistent with CHF. Superimposed pneumonia is not entirely excluded but believed less likely. No acute osseous abnormality.  IMPRESSION: Cardiomegaly with small posterior pleural effusions and diffuse airspace disease consistent with CHF. Electronically Signed   By: Tollie Eth M.D.   On: 11/08/2016 23:55    Chart has been reviewed    Assessment/Plan  56 y.o. female with medical history significant of CHF, hx of intubation for respiratory failure, DM 2, HTN     Admitted for Acute Respiratory failure with hypoxia likely secondary to CHFexacerbation  Present on Admission:  . Acute respiratory failure with hypoxia (HCC) Most likely secondary to CHF exacerbation but given recent admission travel history leg edema and significantly elevated d-dimer Will obtain VQ scan and Dopplers of lower extremity . Acute systolic CHF (congestive heart failure) (HCC) - - admit on telemetry, cycle cardiac enzymes, obtain serial ECG, to evaluate for ischemia as a cause of heart failure  monitor daily weight  diurese with IV lasix and monitor orthostatics and creatinine to avoid over diuresis.  Order echogram to evaluate EF and valves patient is on BiDil restart losartan once kidney function is stable Consider cardiology consult  . Anemia - obtain anemia panel, hemoccult stool, obtain type and screen . Hypertension - restart home medications . DM (diabetes mellitus), type 2 with renal complications (HCC) -  - Order Sensitive SSI   - continue home insulin regimen   Lantus 25 units,  -  check TSH and HgA1C   . AKI (acute kidney injury) (HCC) - suspect patient has chronic kidney disease unknown baseline obtain urine electrolytes  . Thrombocytopenia (HCC) - patient unaware of prior history of thrombocytopenia. We'll check haptoglobin will need to have father follow up with no evidence of acute hemolysis. No schistocytes noted Other plan as per orders.  DVT prophylaxis:  SCD   Code Status:  FULL CODE   as per patient    Family Communication:   Family not  at  Bedside    Disposition Plan:       To home once workup is complete  and patient is stable           Consults called: emailed Cardiology     Admission status:    inpatient       Level of care    tele       I have spent a total of 56 min on this admission   Barbara Velazquez 11/09/2016, 1:37 AM    [facility]  Pager [phone]   after 2 AM please page floor coverage PA If 7AM-7PM, please contact the day team taking care of the patient  [email or web address]  Password [email or web address]

## 2016-11-09 NOTE — Progress Notes (Signed)
[facility]  Agent admitted after midnight see H&P for further detail  Patient seen and chart reviewed  Barbara Velazquez is a 56 year old female with medical history of CHF, diabetes mellitus type 2, hypertension. Presented to the emergency department complaining of shortness of breath. She was admitted for acute respiratory failure likely secondary to CHF exacerbation and to rule out pulmonary embolism. VQ scan has been negative. She had diuresis well. Cardiology recommendations appreciated. Echocardiogram pending  We'll continue management per current recommendations. We'll add iron tablets for iron deficiency anemia Check CBC, and BMP in the morning.  Rest H&P  Latrelle Dodrill, MD

## 2016-11-10 DIAGNOSIS — I5031 Acute diastolic (congestive) heart failure: Secondary | ICD-10-CM

## 2016-11-10 DIAGNOSIS — D500 Iron deficiency anemia secondary to blood loss (chronic): Secondary | ICD-10-CM

## 2016-11-10 DIAGNOSIS — E0590 Thyrotoxicosis, unspecified without thyrotoxic crisis or storm: Secondary | ICD-10-CM

## 2016-11-10 LAB — HAPTOGLOBIN: Haptoglobin: <10 mg/dL — ABNORMAL LOW (ref 34–200)

## 2016-11-10 LAB — GLUCOSE, CAPILLARY
GLUCOSE-CAPILLARY: 135 mg/dL — AB (ref 65–99)
Glucose-Capillary: 157 mg/dL — ABNORMAL HIGH (ref 65–99)
Glucose-Capillary: 141 mg/dL — ABNORMAL HIGH (ref 65–99)
Glucose-Capillary: 213 mg/dL — ABNORMAL HIGH (ref 65–99)

## 2016-11-10 LAB — BASIC METABOLIC PANEL
Anion gap: 6 (ref 5–15)
BUN: 41 mg/dL — ABNORMAL HIGH (ref 6–20)
CO2: 24 mmol/L (ref 22–32)
CREATININE: 1.75 mg/dL — AB (ref 0.44–1.00)
Calcium: 8.5 mg/dL — ABNORMAL LOW (ref 8.9–10.3)
Chloride: 113 mmol/L — ABNORMAL HIGH (ref 101–111)
GFR calc non Af Amer: 31 mL/min — ABNORMAL LOW (ref >60)
GFR, EST AFRICAN AMERICAN: 36 mL/min — AB (ref >60)
GLUCOSE: 166 mg/dL — AB (ref 65–99)
Potassium: 3.7 mmol/L (ref 3.5–5.1)
Sodium: 143 mmol/L (ref 135–145)

## 2016-11-10 LAB — CBC
HCT: 23.2 % — ABNORMAL LOW (ref 36.0–46.0)
Hemoglobin: 7.5 g/dL — ABNORMAL LOW (ref 12.0–15.0)
MCH: 28.1 pg (ref 26.0–34.0)
MCHC: 32.3 g/dL (ref 30.0–36.0)
MCV: 86.9 fL (ref 78.0–100.0)
Platelets: 84 10*3/uL — ABNORMAL LOW (ref 150–400)
RBC: 2.67 MIL/uL — AB (ref 3.87–5.11)
RDW: 17.7 % — AB (ref 11.5–15.5)
WBC: 7.3 10*3/uL (ref 4.0–10.5)

## 2016-11-10 NOTE — Care Management Note (Signed)
Case Management Note  Patient Details  Name: Berlyn Wesoloski MRN: [id number] Date of Birth: 11/03/1960  Subjective/Objective: 56 y/o f admitted w/CHF. From home. CM referral for CHF screen-1 admit/past 6 months;cardio following. Continue to follow for d/c needs.                   Action/Plan:d/c plan home.   Expected Discharge Date:   (unknown)               Expected Discharge Plan:  Home/Self Care  In-House Referral:     Discharge planning Services  CM Consult  Post Acute Care Choice:    Choice offered to:     DME Arranged:    DME Agency:     HH Arranged:    HH Agency:     Status of Service:  In process, will continue to follow  If discussed at Long Length of Stay Meetings, dates discussed:    Additional Comments:  Lanier Clam, RN 11/10/2016, 11:38 AM

## 2016-11-10 NOTE — Progress Notes (Signed)
Patient ID: Barbara Velazquez, female   DOB: 1960-12-25, 56 y.o.   MRN: [id number]  PROGRESS NOTE    Barbara Velazquez  [id number] DOB: 1960-12-25 DOA: 11/08/2016  PCP: System, Pcp Not In   Brief Narrative:  56 year old female with past medical history of CHF, diabetes mellitus, hypertension who presented to ED with worsening shortness of breath thought to be due to acute decompensated CHF. V/Q scan showed low probability for PE so essentially negative. Cardiology has seen the pt in consultation.   Assessment & Plan:   Active Problems: Acute diastolic CHF / Acute respiratory failure with hypoxia (HCC) - ECHO on this admission with preserved EF - BNP on this admission in 800 range - Cardio has seen the pt in consultation - Continue lasix 40 mg IV Q 12 hours - Continue carvedilol and Bidil - Continue daily weight and strict intake and output  Essential hypertension - Continue coreg and Bidil  Mild troponin elevation - Likely demand ischemia from acute CHF exacerbation - Cardio following   Hyperthyroidism - TSH 0.019 and free T4 1.24 - Will need outpt follow up - Will stop synthroid supplementation due to risk of further lowering TSH level  Thrombocytopenia (HCC) - Platelets are 84 - Monitor daily CBC  Anemia of chronic disease - Continue ferrous sulfate supplementation  - Hgb 7.5  Diabetes mellitus with diabetic nephropathy - Continue Lantus 25 units at bedtime and SSI  Dyslipidemia associated with type 2 DM - Continue Lipitor   CKD (chronic kidney disease), stage III - Monitor daily Cr as pt on lasix and slight upward Cr trend noted in pat 24 hours   DVT prophylaxis: SCD's Code Status: full code  Family Communication: no family at the bedside  Disposition Plan: home once cleared by cardio    Consultants:   Cardiology   Procedures:   LE doppler 8/28 - negative for DVT  ECHO 8/28 - EF 55-60%, small mostly posterior pericardial effusion, no tamponade    Antimicrobials:   None    Subjective: No overnight events.  Objective: Vitals:   11/10/16 0414 11/10/16 0524 11/10/16 0943 11/10/16 1536  BP:  (!) 162/74  (!) 150/59  Pulse:  84  77  Resp:  18 18 18   Temp:  98.4 F (36.9 C)  98.5 F (36.9 C)  TempSrc:  Oral  Oral  SpO2:  97% 95% 93%  Weight: 103.5 kg (228 lb 2.8 oz)     Height:        Intake/Output Summary (Last 24 hours) at 11/10/16 1722 Last data filed at 11/10/16 1626  Gross per 24 hour  Intake                0 ml  Output              300 ml  Net             -300 ml   Filed Weights   11/08/16 2229 11/10/16 0414  Weight: 97.5 kg (215 lb) 103.5 kg (228 lb 2.8 oz)    Examination:  General exam: Appears calm and comfortable  Respiratory system: Clear to auscultation. Respiratory effort normal. Cardiovascular system: S1 & S2 heard, RRR.  Gastrointestinal system: Abdomen is nondistended, soft and nontender. No organomegaly or masses felt. Normal bowel sounds heard. Central nervous system: Alert and oriented. No focal neurological deficits. Extremities: Symmetric 5 x 5 power. Skin: No rashes, lesions or ulcers Psychiatry: Judgement and insight appear normal. Mood & affect appropriate.  Data Reviewed: I have personally reviewed following labs and imaging studies  CBC:  Recent Labs Lab 11/08/16 2247 11/09/16 0512 11/10/16 0416  WBC 8.7 8.5 7.3  NEUTROABS 7.1  --   --   HGB 8.7* 8.4* 7.5*  HCT 26.5* 25.8* 23.2*  MCV 86.6 86.9 86.9  PLT 87* 97* 84*   Basic Metabolic Panel:  Recent Labs Lab 11/08/16 2247 11/09/16 0512 11/10/16 0416  NA 144 145 143  K 3.5 3.7 3.7  CL 115* 113* 113*  CO2 26 26 24   GLUCOSE 184* 136* 166*  BUN 36* 36* 41*  CREATININE 1.68* 1.68* 1.75*  CALCIUM 8.6* 8.8* 8.5*  MG  --  1.8  --   PHOS  --  2.8  --    GFR: Estimated Creatinine Clearance: 43.9 mL/min (A) (by C-G formula based on SCr of 1.75 mg/dL (H)). Liver Function Tests:  Recent Labs Lab 11/08/16 2247  11/09/16 0512  AST 28 20  ALT 35 32  ALKPHOS 94 91  BILITOT 1.1 1.0  PROT 6.3* 6.2*  ALBUMIN 3.2* 3.3*   No results for input(s): LIPASE, AMYLASE in the last 168 hours. No results for input(s): AMMONIA in the last 168 hours. Coagulation Profile: No results for input(s): INR, PROTIME in the last 168 hours. Cardiac Enzymes:  Recent Labs Lab 11/09/16 0337 11/09/16 1115 11/09/16 1532  TROPONINI 0.05* 0.04* 0.04*   BNP (last 3 results) No results for input(s): PROBNP in the last 8760 hours. HbA1C: No results for input(s): HGBA1C in the last 72 hours. CBG:  Recent Labs Lab 11/09/16 1755 11/09/16 2026 11/10/16 0759 11/10/16 1144 11/10/16 1654  GLUCAP 126* 175* 135* 157* 141*   Lipid Profile: No results for input(s): CHOL, HDL, LDLCALC, TRIG, CHOLHDL, LDLDIRECT in the last 72 hours. Thyroid Function Tests:  Recent Labs  11/09/16 0342 11/09/16 1532  TSH 0.019*  --   FREET4  --  1.24*   Anemia Panel:  Recent Labs  11/09/16 0512  VITAMINB12 653  FOLATE 15.8  FERRITIN 97  TIBC 211*  IRON 25*  RETICCTPCT 2.8   Urine analysis:    Component Value Date/Time   COLORURINE YELLOW 11/09/2016 0135   APPEARANCEUR CLEAR 11/09/2016 0135   LABSPEC 1.020 11/09/2016 0135   PHURINE 5.5 11/09/2016 0135   GLUCOSEU NEGATIVE 11/09/2016 0135   HGBUR SMALL (A) 11/09/2016 0135   BILIRUBINUR NEGATIVE 11/09/2016 0135   KETONESUR NEGATIVE 11/09/2016 0135   PROTEINUR 300 (A) 11/09/2016 0135   NITRITE NEGATIVE 11/09/2016 0135   LEUKOCYTESUR TRACE (A) 11/09/2016 0135   Sepsis Labs: @LABRCNTIP (procalcitonin:4,lacticidven:4)   )No results found for this or any previous visit (from the past 240 hour(s)).    Radiology Studies: Dg Chest 2 View  Result Date: 11/08/2016 CLINICAL DATA:  CHF, dyspnea. EXAM: CHEST  2 VIEW COMPARISON:  None. FINDINGS: Cardiomegaly. Diffuse bilateral airspace opacities with small posterior pleural effusions or consistent with CHF. Superimposed  pneumonia is not entirely excluded but believed less likely. No acute osseous abnormality. IMPRESSION: Cardiomegaly with small posterior pleural effusions and diffuse airspace disease consistent with CHF. Electronically Signed   By: Tollie Eth M.D.   On: 11/08/2016 23:55   Nm Pulmonary Perf And Vent  Result Date: 11/09/2016 CLINICAL DATA:  Shortness of breath.  Chest pain. EXAM: NUCLEAR MEDICINE VENTILATION - PERFUSION LUNG SCAN TECHNIQUE: Ventilation images were obtained in multiple projections using inhaled aerosol Tc-14m DTPA. Perfusion images were obtained in multiple projections after intravenous injection of Tc-51m MAA. RADIOPHARMACEUTICALS:  32.6 mCi Technetium-28m  DTPA aerosol inhalation and 4.2 mCi Technetium-56m MAA IV COMPARISON:  Chest x-ray 10/01/2016. FINDINGS: Multifocal bilateral patchy ventilation defects. No significant ventilation defects most likely secondary to bilateral airspace disease previously noted. Tiny perfusion defects present are much smaller than ventilation defects. This is a low probability for pulmonary embolus study. IMPRESSION: Low probability pulmonary embolus. Electronically Signed   By: Maisie Fus  Register   On: 11/09/2016 10:54      Scheduled Meds: . allopurinol  100 mg Oral Daily  . atorvastatin  40 mg Oral Daily  . carvedilol  50 mg Oral BID WC  . ferrous sulfate  325 mg Oral BID WC  . furosemide  40 mg Intravenous Q12H  . insulin aspart  0-5 Units Subcutaneous QHS  . insulin aspart  0-9 Units Subcutaneous TID WC  . insulin glargine  25 Units Subcutaneous QHS  . isosorbide-hydrALAZINE  1 tablet Oral TID  . levothyroxine  25 mcg Oral QAC breakfast  . mometasone-formoterol  2 puff Inhalation BID  . sodium chloride flush  3 mL Intravenous Q12H   Continuous Infusions: . sodium chloride       LOS: 1 day    Time spent: 25 minutes  Greater than 50% of the time spent on counseling and coordinating the care.   Manson Passey, MD [facility] Pager [phone]  If 7PM-7AM, please contact night-coverage [email or web address] Password [email or web address] 11/10/2016, 5:22 PM

## 2016-11-10 NOTE — Progress Notes (Signed)
Pt. placed on CPAP @ this time, tolerating well, made aware to notify if needed. 

## 2016-11-10 NOTE — Progress Notes (Signed)
Progress Note  Patient Name: Barbara Velazquez Date of Encounter: 11/10/2016  Primary Cardiologist:  NEW  Dr. Eden Emms from [facility]  Subjective   Experiencing SOB still with minimal exertion. No Chest pains. Otherwise feelling well. She is eating breakfast.   Inpatient Medications    Scheduled Meds: . allopurinol  100 mg Oral Daily  . atorvastatin  40 mg Oral Daily  . carvedilol  50 mg Oral BID WC  . ferrous sulfate  325 mg Oral BID WC  . furosemide  40 mg Intravenous Q12H  . insulin aspart  0-5 Units Subcutaneous QHS  . insulin aspart  0-9 Units Subcutaneous TID WC  . insulin glargine  25 Units Subcutaneous QHS  . isosorbide-hydrALAZINE  1 tablet Oral TID  . levothyroxine  25 mcg Oral QAC breakfast  . mometasone-formoterol  2 puff Inhalation BID  . sodium chloride flush  3 mL Intravenous Q12H   Continuous Infusions: . sodium chloride     PRN Meds: sodium chloride, acetaminophen **OR** acetaminophen, ondansetron **OR** ondansetron (ZOFRAN) IV, sodium chloride flush   Vital Signs    Vitals:   11/09/16 2012 11/09/16 2240 11/10/16 0414 11/10/16 0524  BP: 140/65   (!) 162/74  Pulse: 89   84  Resp: 18   18  Temp: 98.2 F (36.8 C)   98.4 F (36.9 C)  TempSrc: Oral   Oral  SpO2: 92% 92%  97%  Weight:   228 lb 2.8 oz (103.5 kg)   Height:       No intake or output data in the 24 hours ending 11/10/16 0854 Filed Weights   11/08/16 2229 11/10/16 0414  Weight: 215 lb (97.5 kg) 228 lb 2.8 oz (103.5 kg)    Telemetry    Sinus rhythm occasional PVC - Personally Reviewed   Physical Exam   GEN: Well nourished, well developed, obese AA female HEENT: normal  Neck: no JVD, carotid bruits, or masses Cardiac: RRR. no murmurs, rubs, or gallops,no edema. Intact distal pulses bilaterally.  Respiratory:  normal work of breathing, crackles at the lung bases- mild GI: soft, nontender, nondistended, + BS MS: no deformity or atrophy  Skin: warm and dry, no rash Neuro:  Alert and Oriented x 3, Strength and sensation are intact Psych:   Full affect  Labs    Chemistry Recent Labs Lab 11/08/16 2247 11/09/16 0512 11/10/16 0416  NA 144 145 143  K 3.5 3.7 3.7  CL 115* 113* 113*  CO2 26 26 24   GLUCOSE 184* 136* 166*  BUN 36* 36* 41*  CREATININE 1.68* 1.68* 1.75*  CALCIUM 8.6* 8.8* 8.5*  PROT 6.3* 6.2*  --   ALBUMIN 3.2* 3.3*  --   AST 28 20  --   ALT 35 32  --   ALKPHOS 94 91  --   BILITOT 1.1 1.0  --   GFRNONAA 33* 33* 31*  GFRAA 38* 38* 36*  ANIONGAP 3* 6 6     Hematology Recent Labs Lab 11/08/16 2247 11/09/16 0512 11/10/16 0416  WBC 8.7 8.5 7.3  RBC 3.06* 2.97*  2.97* 2.67*  HGB 8.7* 8.4* 7.5*  HCT 26.5* 25.8* 23.2*  MCV 86.6 86.9 86.9  MCH 28.4 28.3 28.1  MCHC 32.8 32.6 32.3  RDW 17.8* 17.8* 17.7*  PLT 87* 97* 84*    Cardiac Enzymes Recent Labs Lab 11/09/16 0337 11/09/16 1115 11/09/16 1532  TROPONINI 0.05* 0.04* 0.04*    Recent Labs Lab 11/08/16 2354  TROPIPOC 0.04  BNP Recent Labs Lab 11/08/16 2346  BNP 814.0*     DDimer  Recent Labs Lab 11/08/16 2247  DDIMER 2.99*     Radiology    Dg Chest 2 View  Result Date: 11/08/2016 CLINICAL DATA:  CHF, dyspnea. EXAM: CHEST  2 VIEW COMPARISON:  None. FINDINGS: Cardiomegaly. Diffuse bilateral airspace opacities with small posterior pleural effusions or consistent with CHF. Superimposed pneumonia is not entirely excluded but believed less likely. No acute osseous abnormality. IMPRESSION: Cardiomegaly with small posterior pleural effusions and diffuse airspace disease consistent with CHF. Electronically Signed   By: Tollie Eth M.D.   On: 11/08/2016 23:55   Nm Pulmonary Perf And Vent  Result Date: 11/09/2016 CLINICAL DATA:  Shortness of breath.  Chest pain. EXAM: NUCLEAR MEDICINE VENTILATION - PERFUSION LUNG SCAN TECHNIQUE: Ventilation images were obtained in multiple projections using inhaled aerosol Tc-70m DTPA. Perfusion images were obtained in multiple  projections after intravenous injection of Tc-7m MAA. RADIOPHARMACEUTICALS:  32.6 mCi Technetium-71m DTPA aerosol inhalation and 4.2 mCi Technetium-64m MAA IV COMPARISON:  Chest x-ray 10/01/2016. FINDINGS: Multifocal bilateral patchy ventilation defects. No significant ventilation defects most likely secondary to bilateral airspace disease previously noted. Tiny perfusion defects present are much smaller than ventilation defects. This is a low probability for pulmonary embolus study. IMPRESSION: Low probability pulmonary embolus. Electronically Signed   By: Maisie Fus  Register   On: 11/09/2016 10:54    Cardiac Studies   Echocardiogram pending   Bilateral lower extremity venous duplex08/28/18 9:32 AM There is no evidence of deep or superficial vein thrombosis involving the right and left lower extremities. All visualized vessels appear patent and compressible. There is no evidence of Baker's cysts bilaterally.  Nm Pulmonary Perf And Vent Result Date: 11/09/2016 CLINICAL DATA:  Shortness of breath.  Chest pain. EXAM: NUCLEAR MEDICINE VENTILATION - PERFUSION LUNG SCAN TECHNIQUE: Ventilation images were obtained in multiple projections using inhaled aerosol Tc-2m DTPA. Perfusion images were obtained in multiple projections after intravenous injection of Tc-22m MAA. RADIOPHARMACEUTICALS:  32.6 mCi Technetium-38m DTPA aerosol inhalation and 4.2 mCi Technetium-4m MAA IV COMPARISON:  Chest x-ray 10/01/2016. FINDINGS: Multifocal bilateral patchy ventilation defects. No significant ventilation defects most likely secondary to bilateral airspace disease previously noted. Tiny perfusion defects present are much smaller than ventilation defects. This is a low probability for pulmonary embolus study. IMPRESSION: Low probability pulmonary embolus. Electronically Signed   By: Maisie Fus  Register   On: 11/09/2016 10:54    Patient Profile     Barbara Velazquez is a 56 y.o. female with a hx of CHF, with hx of intubation for  respiratory failure, DM-2, HTN who is being seen for the evaluation of CHF  Assessment & Plan     Hypertension: Patients blood pressure is still poorly controlled but improved from yesterday. -- Home dose of lasix and coreg continued  -- ARB increased to 50 mg daily this admission.  Acute CHF: No ischemic work-up planned at this time. Dr. Eden Emms to review echocardiogram. -- I/Os not being measured nor are daily weights. wiIll order this as we plan diurese her more aggressively.  DM-2: Per IM and SSI  Anemia and iron deficiency: Per IM,  Per Dr. Eden Emms the anemia, poorly controlled hypertension and hyperthyroidism are likely contributing the most to Ms. Palau's symptoms. -- hemoglobin is worse today 8.7 > 8.4 >> 7.5  Acute vs Chronic Kidney Injury: She most likely has chronic kidney disease but her creatinine, BUN and GFR are  not improving.   Signed, Dorthula Matas, PA-C  11/10/2016, 8:54 AM    Patient examined chart reviewed. Primarily medical issues. Obesity with poorly controlled HTN anemia And hyperthyroidism. Elevated free T4 and suppressed TSH may explain unusually high dose of coreg she is on Continue iv lasix today and change to PO in am. Echo with normal EF no bad valves grade 2 diastolic dysfunction for which diuretic and BP control important Needs further w/u of anemia as well  Will sign off   Charlton HawsPeter Maicey Barrientez

## 2016-11-11 LAB — BASIC METABOLIC PANEL
Anion gap: 6 (ref 5–15)
BUN: 46 mg/dL — AB (ref 6–20)
CO2: 26 mmol/L (ref 22–32)
CREATININE: 2.07 mg/dL — AB (ref 0.44–1.00)
Calcium: 8.7 mg/dL — ABNORMAL LOW (ref 8.9–10.3)
Chloride: 112 mmol/L — ABNORMAL HIGH (ref 101–111)
GFR calc Af Amer: 29 mL/min — ABNORMAL LOW (ref >60)
GFR, EST NON AFRICAN AMERICAN: 25 mL/min — AB (ref >60)
GLUCOSE: 120 mg/dL — AB (ref 65–99)
POTASSIUM: 3.9 mmol/L (ref 3.5–5.1)
Sodium: 144 mmol/L (ref 135–145)

## 2016-11-11 LAB — CBC
HCT: 21.9 % — ABNORMAL LOW (ref 36.0–46.0)
Hemoglobin: 7.4 g/dL — ABNORMAL LOW (ref 12.0–15.0)
MCH: 29.1 pg (ref 26.0–34.0)
MCHC: 33.8 g/dL (ref 30.0–36.0)
MCV: 86.2 fL (ref 78.0–100.0)
PLATELETS: 84 10*3/uL — AB (ref 150–400)
RBC: 2.54 MIL/uL — AB (ref 3.87–5.11)
RDW: 17.8 % — AB (ref 11.5–15.5)
WBC: 7 10*3/uL (ref 4.0–10.5)

## 2016-11-11 LAB — GLUCOSE, CAPILLARY
GLUCOSE-CAPILLARY: 178 mg/dL — AB (ref 65–99)
Glucose-Capillary: 111 mg/dL — ABNORMAL HIGH (ref 65–99)
Glucose-Capillary: 151 mg/dL — ABNORMAL HIGH (ref 65–99)
Glucose-Capillary: 155 mg/dL — ABNORMAL HIGH (ref 65–99)

## 2016-11-11 MED ORDER — FUROSEMIDE 40 MG PO TABS
40.0000 mg | ORAL_TABLET | Freq: Every day | ORAL | Status: DC
  Administered 2016-11-12 – 2016-11-13 (×2): 40 mg via ORAL
  Filled 2016-11-11 (×2): qty 1

## 2016-11-11 NOTE — Progress Notes (Signed)
Progress Note  Patient Name: Barbara Velazquez Date of Encounter: 11/11/2016  Primary Cardiologist:  NEW  [facility] [place]  Subjective   Still complains of dyspnea    Inpatient Medications    Scheduled Meds: . allopurinol  100 mg Oral Daily  . atorvastatin  40 mg Oral Daily  . carvedilol  50 mg Oral BID WC  . ferrous sulfate  325 mg Oral BID WC  . furosemide  40 mg Intravenous Q12H  . insulin aspart  0-5 Units Subcutaneous QHS  . insulin aspart  0-9 Units Subcutaneous TID WC  . insulin glargine  25 Units Subcutaneous QHS  . isosorbide-hydrALAZINE  1 tablet Oral TID  . mometasone-formoterol  2 puff Inhalation BID  . sodium chloride flush  3 mL Intravenous Q12H   Continuous Infusions: . sodium chloride     PRN Meds: sodium chloride, acetaminophen **OR** acetaminophen, ondansetron **OR** ondansetron (ZOFRAN) IV, sodium chloride flush   Vital Signs    Vitals:   11/10/16 2124 11/11/16 0439 11/11/16 0440 11/11/16 0923  BP:   (!) 153/54   Pulse:   81   Resp:   18   Temp:   (!) 97.4 F (36.3 C)   TempSrc:   Oral   SpO2: 92%  97% 98%  Weight:  228 lb 9.9 oz (103.7 kg)    Height:        Intake/Output Summary (Last 24 hours) at 11/11/16 1055 Last data filed at 11/11/16 0900  Gross per 24 hour  Intake                0 ml  Output             1625 ml  Net            -1625 ml   Filed Weights   11/08/16 2229 11/10/16 0414 11/11/16 0439  Weight: 215 lb (97.5 kg) 228 lb 2.8 oz (103.5 kg) 228 lb 9.9 oz (103.7 kg)    Telemetry    Sinus rhythm occasional PVC - Personally Reviewed   Physical Exam   GEN: Well nourished, well developed, obese AA female HEENT: normal  Neck: no JVD, carotid bruits, or masses Cardiac: RRR.soft SEM  murmurs, rubs, or gallops,no edema. Intact distal pulses bilaterally.  Respiratory:  normal work of breathing, crackles at the lung bases- mild GI: soft, nontender, nondistended, + BS MS: no deformity or atrophy  Skin: warm and  dry, no rash Neuro: Alert and Oriented x 3, Strength and sensation are intact Psych:   Full affect  Labs    Chemistry  Recent Labs Lab 11/08/16 2247 11/09/16 0512 11/10/16 0416 11/11/16 0440  NA 144 145 143 144  K 3.5 3.7 3.7 3.9  CL 115* 113* 113* 112*  CO2 26 26 24 26   GLUCOSE 184* 136* 166* 120*  BUN 36* 36* 41* 46*  CREATININE 1.68* 1.68* 1.75* 2.07*  CALCIUM 8.6* 8.8* 8.5* 8.7*  PROT 6.3* 6.2*  --   --   ALBUMIN 3.2* 3.3*  --   --   AST 28 20  --   --   ALT 35 32  --   --   ALKPHOS 94 91  --   --   BILITOT 1.1 1.0  --   --   GFRNONAA 33* 33* 31* 25*  GFRAA 38* 38* 36* 29*  ANIONGAP 3* 6 6 6      Hematology  Recent Labs Lab 11/09/16 [id number] 11/10/16 0416 11/11/16 0440  WBC 8.5 7.3 7.0  RBC 2.97*  2.97* 2.67* 2.54*  HGB 8.4* 7.5* 7.4*  HCT 25.8* 23.2* 21.9*  MCV 86.9 86.9 86.2  MCH 28.3 28.1 29.1  MCHC 32.6 32.3 33.8  RDW 17.8* 17.7* 17.8*  PLT 97* 84* 84*    Cardiac Enzymes  Recent Labs Lab 11/09/16 0337 11/09/16 1115 11/09/16 1532  TROPONINI 0.05* 0.04* 0.04*     Recent Labs Lab 11/08/16 2354  TROPIPOC 0.04     BNP  Recent Labs Lab 11/08/16 2346  BNP 814.0*     DDimer   Recent Labs Lab 11/08/16 2247  DDIMER 2.99*     Radiology    No results found.  Cardiac Studies   Echocardiogram pending   Bilateral lower extremity venous duplex08/28/18 9:32 AM There is no evidence of deep or superficial vein thrombosis involving the right and left lower extremities. All visualized vessels appear patent and compressible. There is no evidence of Baker's cysts bilaterally.  Nm Pulmonary Perf And Vent Result Date: 11/09/2016 CLINICAL DATA:  Shortness of breath.  Chest pain. EXAM: NUCLEAR MEDICINE VENTILATION - PERFUSION LUNG SCAN TECHNIQUE: Ventilation images were obtained in multiple projections using inhaled aerosol Tc-29m DTPA. Perfusion images were obtained in multiple projections after intravenous injection of Tc-17m MAA.  RADIOPHARMACEUTICALS:  32.6 mCi Technetium-42m DTPA aerosol inhalation and 4.2 mCi Technetium-76m MAA IV COMPARISON:  Chest x-ray 10/01/2016. FINDINGS: Multifocal bilateral patchy ventilation defects. No significant ventilation defects most likely secondary to bilateral airspace disease previously noted. Tiny perfusion defects present are much smaller than ventilation defects. This is a low probability for pulmonary embolus study. IMPRESSION: Low probability pulmonary embolus. Electronically Signed   By: Maisie Fus  Register   On: 11/09/2016 10:54    Patient Profile     Barbara Velazquez is a 56 y.o. female with a hx of CHF, with hx of intubation for respiratory failure, DM-2, HTN who is being seen for the evaluation of CHF  Assessment & Plan     Hypertension: better on higher dose ARB   Acute CHF: echo with normal EF no valve disease diastolic dysfunction Dyspnea dysproportionate to clinical findings change lasix to PO and cut dose back as she is azotemic  DM-2: Per IM and SSI  Anemia and iron deficiency:  Anemia, poorly controlled hypertension and hyperthyroidism are likely contributing the most to Barbara Velazquez's symptoms. -- hemoglobin is   Lab Results  Component Value Date   HCT 21.9 (L) 11/11/2016    Acute vs Chronic Kidney Injury: She most likely has chronic kidney disease from HTN cut back diuretic    Will sign off  Charlton Haws

## 2016-11-11 NOTE — Progress Notes (Signed)
Patient on CPAP and doing well. 5cmH20 and 2l O2 bleed in. NO issues at this time

## 2016-11-11 NOTE — Evaluation (Signed)
Physical Therapy Evaluation Patient Details Name: Barbara Velazquez MRN: [id number] DOB: 1961-03-13 Today's Date: 11/11/2016   History of Present Illness  56 year old female with past medical history of CHF, diabetes mellitus, hypertension who presented to ED 11/08/16 with worsening shortness of breath thought to be due to acute decompensated CHF. V/Q scan showed low probability for PE ,  She most likely has chronic kidney disease from HTN   Clinical Impression  The  Patient is independent in room for mobility. Ambulated longer distance in hallway. Oxygen stauration >94% on RA. Instructed in pursed lip breathing.  Pt admitted with above diagnosis. Pt currently with functional limitations due to the deficits listed below (see PT Problem List). Pt will benefit from skilled PT to increase their independence and safety with mobility to allow discharge to the venue listed below.       Follow Up Recommendations No PT follow up    Equipment Recommendations  None recommended by PT    Recommendations for Other Services       Precautions / Restrictions Precautions Precaution Comments: monitor sats      Mobility  Bed Mobility Overal bed mobility: Independent                Transfers Overall transfer level: Independent                  Ambulation/Gait Ambulation/Gait assistance: Supervision Ambulation Distance (Feet): 200 Feet Assistive device: None Gait Pattern/deviations: Step-through pattern   Gait velocity interpretation: at or above normal speed for age/gender General Gait Details: required seated reast break after 150', then ambulated 250 with saturation 94%  Stairs            Wheelchair Mobility    Modified Rankin (Stroke Patients Only)       Balance                                             Pertinent Vitals/Pain Pain Assessment: No/denies pain    Home Living Family/patient expects to be discharged to:: Private residence Living  Arrangements: Children Available Help at Discharge: Family Type of Home: Apartment Home Access: Level entry     Home Layout: Two level;Bed/bath upstairs Home Equipment: None      Prior Function                 Hand Dominance        Extremity/Trunk Assessment   Upper Extremity Assessment Upper Extremity Assessment: Overall WFL for tasks assessed    Lower Extremity Assessment Lower Extremity Assessment: Overall WFL for tasks assessed    Cervical / Trunk Assessment Cervical / Trunk Assessment: Normal  Communication      Cognition Arousal/Alertness: Awake/alert Behavior During Therapy: WFL for tasks assessed/performed Overall Cognitive Status: Within Functional Limits for tasks assessed                                        General Comments      Exercises     Assessment/Plan    PT Assessment Patient needs continued PT services  PT Problem List Decreased activity tolerance       PT Treatment Interventions Gait training;Patient/family education;Stair training    PT Goals (Current goals can be found in the Care Plan section)  Acute  Rehab PT Goals Patient Stated Goal: to go home, get back to my doctors and fix my vision. PT Goal Formulation: With patient Time For Goal Achievement: 11/18/16 Potential to Achieve Goals: Good    Frequency Min 3X/week   Barriers to discharge        Co-evaluation               AM-PAC PT "6 Clicks" Daily Activity  Outcome Measure Difficulty turning over in bed (including adjusting bedclothes, sheets and blankets)?: None Difficulty moving from lying on back to sitting on the side of the bed? : None Difficulty sitting down on and standing up from a chair with arms (e.g., wheelchair, bedside commode, etc,.)?: None Help needed moving to and from a bed to chair (including a wheelchair)?: None Help needed walking in hospital room?: None Help needed climbing 3-5 steps with a railing? : A Little 6 Click  Score: 23    End of Session   Activity Tolerance: Patient tolerated treatment well Patient left: in bed Nurse Communication: Mobility status PT Visit Diagnosis: Difficulty in walking, not elsewhere classified (R26.2)    Time: 1610-9604 PT Time Calculation (min) (ACUTE ONLY): 24 min   Charges:   PT Evaluation $PT Eval Low Complexity: 1 Low PT Treatments $Gait Training: 8-22 mins   PT G CodesBlanchard Velazquez PT [phone]   Barbara Velazquez 11/11/2016, 3:15 PM

## 2016-11-11 NOTE — Progress Notes (Signed)
Patient ID: Barbara Velazquez, female   DOB: 25-Sep-1960, 56 y.o.   MRN: [id number]  PROGRESS NOTE    Barbara Velazquez  WUJ:[id number] DOB: 1960-12-14 DOA: 11/08/2016  PCP: System, Pcp Not In   Brief Narrative:  56 year old female with past medical history of CHF, diabetes mellitus, hypertension who presented to ED with worsening shortness of breath thought to be due to acute decompensated CHF. V/Q scan showed low probability for PE so essentially negative. Cardiology has seen the pt in consultation.   Assessment & Plan:   Active Problems: Acute diastolic CHF / Acute respiratory failure with hypoxia (HCC) - ECHO on this admission with preserved EF - BNP on this admission in 800 range - Appreciate cardio following; recommended changing lasix to PO regimen; they signed off today 8/30 - Continue Bidil and carvedilol   Essential hypertension - Continue coreg and bidil  Mild troponin elevation - Likely demand ischemia from acute CHF exacerbation - No further work up required   Hyperthyroidism - TSH 0.019 and free T4 1.24 - Will need outpt follow up - Stopped synthroid supplementation 8/30   Thrombocytopenia (HCC) - Unclear etiology, no other labs for comparison prior to this admission, pt from [place] and does not know what her platelets were in past  - No bleeding  - Monitor daily CBC  Anemia of chronic disease - Continue ferrous sulfate supplementation  - Monitor CBC daily   Diabetes mellitus with diabetic nephropathy - Continue Lantus 25 units at bedtime and SSI  Dyslipidemia associated with type 2 DM - Continue Lipitor   CKD (chronic kidney disease), stage III - Cr 2.07 and changed lasix to PO per cardio recommendations    - Follow up BMP in am  DVT prophylaxis: SCD's Code Status: full code  Family Communication: no family at the bedside Disposition Plan: home likely in next 24 hours    Consultants:   Cardiology   Procedures:   LE doppler 8/28 - negative for  DVT  ECHO 8/28 - EF 55-60%, small mostly posterior pericardial effusion, no tamponade   Antimicrobials:   None    Subjective: No overnight events.  Objective: Vitals:   11/10/16 2124 11/11/16 0439 11/11/16 0440 11/11/16 0923  BP:   (!) 153/54   Pulse:   81   Resp:   18   Temp:   (!) 97.4 F (36.3 C)   TempSrc:   Oral   SpO2: 92%  97% 98%  Weight:  103.7 kg (228 lb 9.9 oz)    Height:        Intake/Output Summary (Last 24 hours) at 11/11/16 1116 Last data filed at 11/11/16 0900  Gross per 24 hour  Intake                0 ml  Output             1625 ml  Net            -1625 ml   Filed Weights   11/08/16 2229 11/10/16 0414 11/11/16 0439  Weight: 97.5 kg (215 lb) 103.5 kg (228 lb 2.8 oz) 103.7 kg (228 lb 9.9 oz)    Physical Exam  Constitutional: Appears well-developed and well-nourished. No distress.   CVS: RRR, S1/S2 + Pulmonary: Effort and breath sounds normal, no stridor, rhonchi, wheezes, rales.  Abdominal: Soft. BS +,  no distension, tenderness, rebound or guarding.  Musculoskeletal: Normal range of motion. No edema and no tenderness.  Neuro: Alert. Normal reflexes, muscle  tone coordination. No cranial nerve deficit. Skin: Skin is warm and dry.  Psychiatric: Behavior, judgment, thought content normal.      Data Reviewed: I have personally reviewed following labs and imaging studies  CBC:  Recent Labs Lab 11/08/16 2247 11/09/16 0512 11/10/16 0416 11/11/16 0440  WBC 8.7 8.5 7.3 7.0  NEUTROABS 7.1  --   --   --   HGB 8.7* 8.4* 7.5* 7.4*  HCT 26.5* 25.8* 23.2* 21.9*  MCV 86.6 86.9 86.9 86.2  PLT 87* 97* 84* 84*   Basic Metabolic Panel:  Recent Labs Lab 11/08/16 2247 11/09/16 0512 11/10/16 0416 11/11/16 0440  NA 144 145 143 144  K 3.5 3.7 3.7 3.9  CL 115* 113* 113* 112*  CO2 26 26 24 26   GLUCOSE 184* 136* 166* 120*  BUN 36* 36* 41* 46*  CREATININE 1.68* 1.68* 1.75* 2.07*  CALCIUM 8.6* 8.8* 8.5* 8.7*  MG  --  1.8  --   --   PHOS  --  2.8   --   --    GFR: Estimated Creatinine Clearance: 37.1 mL/min (A) (by C-G formula based on SCr of 2.07 mg/dL (H)). Liver Function Tests:  Recent Labs Lab 11/08/16 2247 11/09/16 0512  AST 28 20  ALT 35 32  ALKPHOS 94 91  BILITOT 1.1 1.0  PROT 6.3* 6.2*  ALBUMIN 3.2* 3.3*   No results for input(s): LIPASE, AMYLASE in the last 168 hours. No results for input(s): AMMONIA in the last 168 hours. Coagulation Profile: No results for input(s): INR, PROTIME in the last 168 hours. Cardiac Enzymes:  Recent Labs Lab 11/09/16 0337 11/09/16 1115 11/09/16 1532  TROPONINI 0.05* 0.04* 0.04*   BNP (last 3 results) No results for input(s): PROBNP in the last 8760 hours. HbA1C: No results for input(s): HGBA1C in the last 72 hours. CBG:  Recent Labs Lab 11/10/16 0759 11/10/16 1144 11/10/16 1654 11/10/16 2059 11/11/16 0753  GLUCAP 135* 157* 141* 213* 111*   Lipid Profile: No results for input(s): CHOL, HDL, LDLCALC, TRIG, CHOLHDL, LDLDIRECT in the last 72 hours. Thyroid Function Tests:  Recent Labs  11/09/16 0342 11/09/16 1532  TSH 0.019*  --   FREET4  --  1.24*   Anemia Panel:  Recent Labs  11/09/16 0512  VITAMINB12 653  FOLATE 15.8  FERRITIN 97  TIBC 211*  IRON 25*  RETICCTPCT 2.8   Urine analysis:    Component Value Date/Time   COLORURINE YELLOW 11/09/2016 0135   APPEARANCEUR CLEAR 11/09/2016 0135   LABSPEC 1.020 11/09/2016 0135   PHURINE 5.5 11/09/2016 0135   GLUCOSEU NEGATIVE 11/09/2016 0135   HGBUR SMALL (A) 11/09/2016 0135   BILIRUBINUR NEGATIVE 11/09/2016 0135   KETONESUR NEGATIVE 11/09/2016 0135   PROTEINUR 300 (A) 11/09/2016 0135   NITRITE NEGATIVE 11/09/2016 0135   LEUKOCYTESUR TRACE (A) 11/09/2016 0135   Sepsis Labs: @LABRCNTIP (procalcitonin:4,lacticidven:4)   )No results found for this or any previous visit (from the past 240 hour(s)).    Radiology Studies: Dg Chest 2 View  Result Date: 11/08/2016 CLINICAL DATA:  CHF, dyspnea. EXAM:  CHEST  2 VIEW COMPARISON:  None. FINDINGS: Cardiomegaly. Diffuse bilateral airspace opacities with small posterior pleural effusions or consistent with CHF. Superimposed pneumonia is not entirely excluded but believed less likely. No acute osseous abnormality. IMPRESSION: Cardiomegaly with small posterior pleural effusions and diffuse airspace disease consistent with CHF. Electronically Signed   By: Tollie Eth M.D.   On: 11/08/2016 23:55   Nm Pulmonary Perf And Vent  Result  Date: 11/09/2016 CLINICAL DATA:  Shortness of breath.  Chest pain. EXAM: NUCLEAR MEDICINE VENTILATION - PERFUSION LUNG SCAN TECHNIQUE: Ventilation images were obtained in multiple projections using inhaled aerosol Tc-2442m DTPA. Perfusion images were obtained in multiple projections after intravenous injection of Tc-8042m MAA. RADIOPHARMACEUTICALS:  32.6 mCi Technetium-2342m DTPA aerosol inhalation and 4.2 mCi Technetium-3042m MAA IV COMPARISON:  Chest x-ray 10/01/2016. FINDINGS: Multifocal bilateral patchy ventilation defects. No significant ventilation defects most likely secondary to bilateral airspace disease previously noted. Tiny perfusion defects present are much smaller than ventilation defects. This is a low probability for pulmonary embolus study. IMPRESSION: Low probability pulmonary embolus. Electronically Signed   By: Maisie Fushomas  Register   On: 11/09/2016 10:54      Scheduled Meds: . allopurinol  100 mg Oral Daily  . atorvastatin  40 mg Oral Daily  . carvedilol  50 mg Oral BID WC  . ferrous sulfate  325 mg Oral BID WC  . [START ON 11/12/2016] furosemide  40 mg Oral Daily  . insulin aspart  0-5 Units Subcutaneous QHS  . insulin aspart  0-9 Units Subcutaneous TID WC  . insulin glargine  25 Units Subcutaneous QHS  . isosorbide-hydrALAZINE  1 tablet Oral TID  . mometasone-formoterol  2 puff Inhalation BID  . sodium chloride flush  3 mL Intravenous Q12H   Continuous Infusions: . sodium chloride       LOS: 2 days    Time  spent: 25 minutes  Greater than 50% of the time spent on counseling and coordinating the care.   Manson PasseyAlma Maddeline Roorda, MD [facility] Pager [phone]  If 7PM-7AM, please contact night-coverage [email or web address] Password [email or web address] 11/11/2016, 11:16 AM

## 2016-11-12 LAB — BASIC METABOLIC PANEL
ANION GAP: 7 (ref 5–15)
BUN: 46 mg/dL — ABNORMAL HIGH (ref 6–20)
CALCIUM: 8.5 mg/dL — AB (ref 8.9–10.3)
CO2: 25 mmol/L (ref 22–32)
Chloride: 110 mmol/L (ref 101–111)
Creatinine, Ser: 1.86 mg/dL — ABNORMAL HIGH (ref 0.44–1.00)
GFR, EST AFRICAN AMERICAN: 34 mL/min — AB (ref >60)
GFR, EST NON AFRICAN AMERICAN: 29 mL/min — AB (ref >60)
Glucose, Bld: 139 mg/dL — ABNORMAL HIGH (ref 65–99)
Potassium: 4.1 mmol/L (ref 3.5–5.1)
SODIUM: 142 mmol/L (ref 135–145)

## 2016-11-12 LAB — CBC
HCT: 22.6 % — ABNORMAL LOW (ref 36.0–46.0)
Hemoglobin: 7.3 g/dL — ABNORMAL LOW (ref 12.0–15.0)
MCH: 27.9 pg (ref 26.0–34.0)
MCHC: 32.3 g/dL (ref 30.0–36.0)
MCV: 86.3 fL (ref 78.0–100.0)
Platelets: 92 10*3/uL — ABNORMAL LOW (ref 150–400)
RBC: 2.62 MIL/uL — ABNORMAL LOW (ref 3.87–5.11)
RDW: 18.1 % — ABNORMAL HIGH (ref 11.5–15.5)
WBC: 6.7 10*3/uL (ref 4.0–10.5)

## 2016-11-12 LAB — ECHOCARDIOGRAM COMPLETE
HEIGHTINCHES: 67 in
Weight: 3440 oz

## 2016-11-12 LAB — ABO/RH: ABO/RH(D): A POS

## 2016-11-12 LAB — GLUCOSE, CAPILLARY
GLUCOSE-CAPILLARY: 146 mg/dL — AB (ref 65–99)
Glucose-Capillary: 139 mg/dL — ABNORMAL HIGH (ref 65–99)
Glucose-Capillary: 134 mg/dL — ABNORMAL HIGH (ref 65–99)
Glucose-Capillary: 164 mg/dL — ABNORMAL HIGH (ref 65–99)

## 2016-11-12 LAB — PREPARE RBC (CROSSMATCH)

## 2016-11-12 MED ORDER — SODIUM CHLORIDE 0.9 % IV SOLN
Freq: Once | INTRAVENOUS | Status: AC
  Administered 2016-11-12: 14:00:00 via INTRAVENOUS

## 2016-11-12 NOTE — Progress Notes (Signed)
Patient ID: Barbara Velazquez, female   DOB: 06-Feb-1961, 56 y.o.   MRN: [id number]  PROGRESS NOTE    Renley Gutman  [id number]:[id number] DOB: Feb 16, 1961 DOA: 11/08/2016  PCP: System, Pcp Not In   Brief Narrative:  56 year old female with past medical history of CHF, diabetes mellitus, hypertension who presented to ED with worsening shortness of breath thought to be due to acute decompensated CHF. V/Q scan showed low probability for PE so essentially negative. Cardiology has seen the pt in consultation.   Assessment & Plan:   Active Problems: Acute diastolic CHF / Acute respiratory failure with hypoxia (HCC) - ECHO on this admission with preserved EF - BNP on this admission in 800 range - Continue bidil and carvedilol - Continue PO lasix  - Cardio signed off 8/30  Essential hypertension - Continue coreg and bidil  Mild troponin elevation - Likely demand ischemia from acute CHF exacerbation - No further work up required   Hyperthyroidism - TSH 0.019 and free T4 1.24 - Stopped synthroid supplementation 8/30  - Outpt follow up  Thrombocytopenia (HCC) - Unclear etiology, no other labs for comparison prior to this admission, pt from [place] and does not know what her platelets were in past  - Platelets better this am - No bleeding   Anemia of chronic disease - Continue ferrous sulfate supplementation  - Hgb 7.3 this am - Transfuse 1 U PRBC today - Follow up hgb in am  Diabetes mellitus with diabetic nephropathy - Continue Lantus 25 units at bedtime and SSI - CBG's: 155, 178, 139  Dyslipidemia associated with type 2 DM - Continue Lipitor   CKD (chronic kidney disease), stage III - Cr 2.07 and changed lasix to PO per cardio recommendations    - Cr improving - Follow up BMP in am    DVT prophylaxis: SCD's Code Status: full code  Family Communication: no family at the bedside Disposition Plan: home in am if hgb stable    Consultants:   Cardio   Procedures:     LE doppler 8/28 - negative for DVT  ECHO 8/28 - EF 55-60%, small mostly posterior pericardial effusion, no tamponade   Antimicrobials:   None    Subjective: No overnight events.  Objective: Vitals:   11/11/16 1956 11/11/16 2157 11/12/16 0500 11/12/16 0524  BP: (!) 146/80   (!) 153/75  Pulse: 79 87  79  Resp: 18 20  20   Temp: 98.3 F (36.8 C)   97.9 F (36.6 C)  TempSrc: Oral   Oral  SpO2: 100% 100%  95%  Weight:   103.5 kg (228 lb 2.8 oz)   Height:        Intake/Output Summary (Last 24 hours) at 11/12/16 1025 Last data filed at 11/11/16 2200  Gross per 24 hour  Intake              120 ml  Output              200 ml  Net              -80 ml   Filed Weights   11/10/16 0414 11/11/16 0439 11/12/16 0500  Weight: 103.5 kg (228 lb 2.8 oz) 103.7 kg (228 lb 9.9 oz) 103.5 kg (228 lb 2.8 oz)    Examination:  General exam: Appears calm and comfortable  Respiratory system: Clear to auscultation. Respiratory effort normal. Cardiovascular system: S1 & S2 heard, RRR. Gastrointestinal system: Abdomen is nondistended, soft and nontender. No organomegaly  or masses felt. Normal bowel sounds heard. Central nervous system: Alert and oriented. No focal neurological deficits. Extremities: Symmetric 5 x 5 power. Skin: No rashes, lesions or ulcers Psychiatry: Judgement and insight appear normal. Mood & affect appropriate.   Data Reviewed: I have personally reviewed following labs and imaging studies  CBC:  Recent Labs Lab 11/08/16 2247 11/09/16 0512 11/10/16 0416 11/11/16 0440 11/12/16 0449  WBC 8.7 8.5 7.3 7.0 6.7  NEUTROABS 7.1  --   --   --   --   HGB 8.7* 8.4* 7.5* 7.4* 7.3*  HCT 26.5* 25.8* 23.2* 21.9* 22.6*  MCV 86.6 86.9 86.9 86.2 86.3  PLT 87* 97* 84* 84* 92*   Basic Metabolic Panel:  Recent Labs Lab 11/08/16 2247 11/09/16 0512 11/10/16 0416 11/11/16 0440 11/12/16 0449  NA 144 145 143 144 142  K 3.5 3.7 3.7 3.9 4.1  CL 115* 113* 113* 112* 110  CO2 26  26 24 26 25   GLUCOSE 184* 136* 166* 120* 139*  BUN 36* 36* 41* 46* 46*  CREATININE 1.68* 1.68* 1.75* 2.07* 1.86*  CALCIUM 8.6* 8.8* 8.5* 8.7* 8.5*  MG  --  1.8  --   --   --   PHOS  --  2.8  --   --   --    GFR: Estimated Creatinine Clearance: 41.3 mL/min (A) (by C-G formula based on SCr of 1.86 mg/dL (H)). Liver Function Tests:  Recent Labs Lab 11/08/16 2247 11/09/16 0512  AST 28 20  ALT 35 32  ALKPHOS 94 91  BILITOT 1.1 1.0  PROT 6.3* 6.2*  ALBUMIN 3.2* 3.3*   No results for input(s): LIPASE, AMYLASE in the last 168 hours. No results for input(s): AMMONIA in the last 168 hours. Coagulation Profile: No results for input(s): INR, PROTIME in the last 168 hours. Cardiac Enzymes:  Recent Labs Lab 11/09/16 0337 11/09/16 1115 11/09/16 1532  TROPONINI 0.05* 0.04* 0.04*   BNP (last 3 results) No results for input(s): PROBNP in the last 8760 hours. HbA1C: No results for input(s): HGBA1C in the last 72 hours. CBG:  Recent Labs Lab 11/11/16 0753 11/11/16 1209 11/11/16 1732 11/11/16 2042 11/12/16 0822  GLUCAP 111* 151* 155* 178* 139*   Lipid Profile: No results for input(s): CHOL, HDL, LDLCALC, TRIG, CHOLHDL, LDLDIRECT in the last 72 hours. Thyroid Function Tests:  Recent Labs  11/09/16 1532  FREET4 1.24*   Anemia Panel: No results for input(s): VITAMINB12, FOLATE, FERRITIN, TIBC, IRON, RETICCTPCT in the last 72 hours. Urine analysis:    Component Value Date/Time   COLORURINE YELLOW 11/09/2016 0135   APPEARANCEUR CLEAR 11/09/2016 0135   LABSPEC 1.020 11/09/2016 0135   PHURINE 5.5 11/09/2016 0135   GLUCOSEU NEGATIVE 11/09/2016 0135   HGBUR SMALL (A) 11/09/2016 0135   BILIRUBINUR NEGATIVE 11/09/2016 0135   KETONESUR NEGATIVE 11/09/2016 0135   PROTEINUR 300 (A) 11/09/2016 0135   NITRITE NEGATIVE 11/09/2016 0135   LEUKOCYTESUR TRACE (A) 11/09/2016 0135   Sepsis Labs: @LABRCNTIP (procalcitonin:4,lacticidven:4)   )No results found for this or any  previous visit (from the past 240 hour(s)).    Radiology Studies: Dg Chest 2 View  Result Date: 11/08/2016 CLINICAL DATA:  CHF, dyspnea. EXAM: CHEST  2 VIEW COMPARISON:  None. FINDINGS: Cardiomegaly. Diffuse bilateral airspace opacities with small posterior pleural effusions or consistent with CHF. Superimposed pneumonia is not entirely excluded but believed less likely. No acute osseous abnormality. IMPRESSION: Cardiomegaly with small posterior pleural effusions and diffuse airspace disease consistent with CHF. Electronically Signed  By: Tollie Eth M.D.   On: 11/08/2016 23:55   Nm Pulmonary Perf And Vent  Result Date: 11/09/2016 CLINICAL DATA:  Shortness of breath.  Chest pain. EXAM: NUCLEAR MEDICINE VENTILATION - PERFUSION LUNG SCAN TECHNIQUE: Ventilation images were obtained in multiple projections using inhaled aerosol Tc-81m DTPA. Perfusion images were obtained in multiple projections after intravenous injection of Tc-42m MAA. RADIOPHARMACEUTICALS:  32.6 mCi Technetium-62m DTPA aerosol inhalation and 4.2 mCi Technetium-66m MAA IV COMPARISON:  Chest x-ray 10/01/2016. FINDINGS: Multifocal bilateral patchy ventilation defects. No significant ventilation defects most likely secondary to bilateral airspace disease previously noted. Tiny perfusion defects present are much smaller than ventilation defects. This is a low probability for pulmonary embolus study. IMPRESSION: Low probability pulmonary embolus. Electronically Signed   By: Maisie Fus  Register   On: 11/09/2016 10:54        Scheduled Meds: . allopurinol  100 mg Oral Daily  . atorvastatin  40 mg Oral Daily  . carvedilol  50 mg Oral BID WC  . ferrous sulfate  325 mg Oral BID WC  . furosemide  40 mg Oral Daily  . insulin aspart  0-5 Units Subcutaneous QHS  . insulin aspart  0-9 Units Subcutaneous TID WC  . insulin glargine  25 Units Subcutaneous QHS  . isosorbide-hydrALAZINE  1 tablet Oral TID  . mometasone-formoterol  2 puff Inhalation  BID  . sodium chloride flush  3 mL Intravenous Q12H   Continuous Infusions: . sodium chloride    . sodium chloride       LOS: 3 days    Time spent: 25 minutes  Greater than 50% of the time spent on counseling and coordinating the care.   Manson Passey, MD [facility] Pager [phone]  If 7PM-7AM, please contact night-coverage [email or web address] Password [facility] 11/12/2016, 10:25 AM

## 2016-11-12 NOTE — Progress Notes (Signed)
Physical Therapy Treatment Patient Details Name: Barbara BeltKarene Marich MRN: [id number] DOB: 02/18/1961 Today's Date: 11/12/2016    History of Present Illness 56 year old female with past medical history of CHF, diabetes mellitus, hypertension who presented to ED 11/08/16 with worsening shortness of breath thought to be due to acute decompensated CHF. V/Q scan showed low probability for PE ,  She most likely has chronic kidney disease from HTN     PT Comments    Pt sitting EOB Indep on arrival.  Assisted with amb in hallway a greater distance while monitoring sats.  See details below.   Follow Up Recommendations  No PT follow up     Equipment Recommendations  None recommended by PT    Recommendations for Other Services       Precautions / Restrictions Precautions Precaution Comments: monitor sats    Mobility  Bed Mobility Overal bed mobility: Independent                Transfers Overall transfer level: Independent                  Ambulation/Gait Ambulation/Gait assistance: Supervision Ambulation Distance (Feet): 225 Feet Assistive device: None Gait Pattern/deviations: Step-through pattern Gait velocity: WFL   General Gait Details: tolerated an increased distance but reqired 2 standing rest breaks.  HR increased from 89 at rest to 138, RA 96% and dyspnea 3/4 with noted SOB.     Stairs            Wheelchair Mobility    Modified Rankin (Stroke Patients Only)       Balance                                            Cognition Arousal/Alertness: Awake/alert Behavior During Therapy: WFL for tasks assessed/performed Overall Cognitive Status: Within Functional Limits for tasks assessed                                        Exercises      General Comments        Pertinent Vitals/Pain Pain Assessment: No/denies pain    Home Living                      Prior Function            PT Goals (current  goals can now be found in the care plan section) Progress towards PT goals: Progressing toward goals    Frequency    Min 3X/week      PT Plan Current plan remains appropriate    Co-evaluation              AM-PAC PT "6 Clicks" Daily Activity  Outcome Measure  Difficulty turning over in bed (including adjusting bedclothes, sheets and blankets)?: None Difficulty moving from lying on back to sitting on the side of the bed? : None Difficulty sitting down on and standing up from a chair with arms (e.g., wheelchair, bedside commode, etc,.)?: None Help needed moving to and from a bed to chair (including a wheelchair)?: None Help needed walking in hospital room?: A Little Help needed climbing 3-5 steps with a railing? : A Little 6 Click Score: 22    End of Session Equipment Utilized During Treatment: Gait Velazquez  Activity Tolerance: Patient tolerated treatment well Patient left: in bed Nurse Communication: Mobility status PT Visit Diagnosis: Difficulty in walking, not elsewhere classified (R26.2)     Time: 1120-1130 PT Time Calculation (min) (ACUTE ONLY): 10 min  Charges:  $Gait Training: 8-22 mins                    G Codes:       Felecia Shelling  PTA [facility]  Acute  Rehab Pager      [phone]

## 2016-11-12 NOTE — Progress Notes (Signed)
CM referral for weight scale that talks-patient has vision difficulty.  [facility]-they no longer provide weight scales,& they do not have talking weight scales-[facility] is now closed until further notice. Provided patient w/[employer] talking weight scale listing-patient greatly appreciated. No further CM needs.

## 2016-11-13 LAB — BPAM RBC
Blood Product Expiration Date: 201809202359
ISSUE DATE / TIME: 201808311342
Unit Type and Rh: 6200

## 2016-11-13 LAB — TYPE AND SCREEN
ABO/RH(D): A POS
Antibody Screen: NEGATIVE
Unit division: 0

## 2016-11-13 LAB — BASIC METABOLIC PANEL
ANION GAP: 7 (ref 5–15)
BUN: 47 mg/dL — ABNORMAL HIGH (ref 6–20)
CO2: 25 mmol/L (ref 22–32)
Calcium: 8.9 mg/dL (ref 8.9–10.3)
Chloride: 111 mmol/L (ref 101–111)
Creatinine, Ser: 1.8 mg/dL — ABNORMAL HIGH (ref 0.44–1.00)
GFR calc Af Amer: 35 mL/min — ABNORMAL LOW (ref >60)
GFR, EST NON AFRICAN AMERICAN: 30 mL/min — AB (ref >60)
GLUCOSE: 119 mg/dL — AB (ref 65–99)
Potassium: 4 mmol/L (ref 3.5–5.1)
Sodium: 143 mmol/L (ref 135–145)

## 2016-11-13 LAB — CBC
HEMATOCRIT: 26.6 % — AB (ref 36.0–46.0)
Hemoglobin: 8.7 g/dL — ABNORMAL LOW (ref 12.0–15.0)
MCH: 27.9 pg (ref 26.0–34.0)
MCHC: 32.7 g/dL (ref 30.0–36.0)
MCV: 85.3 fL (ref 78.0–100.0)
Platelets: 91 10*3/uL — ABNORMAL LOW (ref 150–400)
RBC: 3.12 MIL/uL — AB (ref 3.87–5.11)
RDW: 18.1 % — ABNORMAL HIGH (ref 11.5–15.5)
WBC: 7.5 10*3/uL (ref 4.0–10.5)

## 2016-11-13 LAB — GLUCOSE, CAPILLARY
GLUCOSE-CAPILLARY: 226 mg/dL — AB (ref 65–99)
Glucose-Capillary: 102 mg/dL — ABNORMAL HIGH (ref 65–99)

## 2016-11-13 MED ORDER — ALBUTEROL SULFATE HFA 108 (90 BASE) MCG/ACT IN AERS: 2.0000 | INHALATION_SPRAY | RESPIRATORY_TRACT | 0 refills | Status: DC | PRN

## 2016-11-13 MED ORDER — LEVOTHYROXINE SODIUM 25 MCG PO TABS: 25.0000 ug | ORAL_TABLET | Freq: Every day | ORAL | 0 refills | Status: DC

## 2016-11-13 MED ORDER — ATORVASTATIN CALCIUM 40 MG PO TABS: 40.0000 mg | ORAL_TABLET | Freq: Every day | ORAL | 0 refills | Status: DC

## 2016-11-13 MED ORDER — CARVEDILOL 25 MG PO TABS: 50.0000 mg | ORAL_TABLET | Freq: Two times a day (BID) | ORAL | 0 refills | Status: DC

## 2016-11-13 MED ORDER — FUROSEMIDE 40 MG PO TABS: 40.0000 mg | ORAL_TABLET | Freq: Every day | ORAL | 0 refills | Status: DC

## 2016-11-13 MED ORDER — FERROUS SULFATE 325 (65 FE) MG PO TBEC: 325.0000 mg | DELAYED_RELEASE_TABLET | Freq: Two times a day (BID) | ORAL | 0 refills | Status: DC

## 2016-11-13 MED ORDER — ISOSORB DINITRATE-HYDRALAZINE 20-37.5 MG PO TABS: 1.0000 | ORAL_TABLET | Freq: Three times a day (TID) | ORAL | 0 refills | Status: DC

## 2016-11-13 MED ORDER — ADVAIR DISKUS 250-50 MCG/DOSE IN AEPB: 1.0000 | INHALATION_SPRAY | Freq: Two times a day (BID) | RESPIRATORY_TRACT | 0 refills | Status: DC | PRN

## 2016-11-13 MED ORDER — ALLOPURINOL 100 MG PO TABS: 100.0000 mg | ORAL_TABLET | Freq: Every day | ORAL | 0 refills | Status: DC

## 2016-11-13 NOTE — Progress Notes (Signed)
Patient given discharge, follow up, and medication instructions, verbalized understanding, IV removed, personal belongings with patient, family to arrange for ride home

## 2016-11-13 NOTE — Discharge Instructions (Signed)
Heart Failure °Heart failure is a condition in which the heart has trouble pumping blood because it has become weak or stiff. This means that the heart does not pump blood efficiently for the body to work well. For some people with heart failure, fluid may back up into the lungs and there may be swelling (edema) in the lower legs. Heart failure is usually a long-term (chronic) condition. It is important for you to take good care of yourself and follow the treatment plan from your health care provider. °What are the causes? °This condition is caused by some health problems, including: °· High blood pressure (hypertension). Hypertension causes the heart muscle to work harder than normal. High blood pressure eventually causes the heart to become stiff and weak. °· Coronary artery disease (CAD). CAD is the buildup of cholesterol and fat (plaques) in the arteries of the heart. °· Heart attack (myocardial infarction). Injured tissue, which is caused by the heart attack, does not contract as well and the heart's ability to pump blood is weakened. °· Abnormal heart valves. When the heart valves do not open and close properly, the heart muscle must pump harder to keep the blood flowing. °· Heart muscle disease (cardiomyopathy or myocarditis). Heart muscle disease is damage to the heart muscle from a variety of causes, such as drug or alcohol abuse, infections, or unknown causes. These can increase the risk of heart failure. °· Lung disease. When the lungs do not work properly, the heart must work harder. ° °What increases the risk? °Risk of heart failure increases as a person ages. This condition is also more likely to develop in people who: °· Are overweight. °· Are female. °· Smoke or chew tobacco. °· Abuse alcohol or illegal drugs. °· Have taken medicines that can damage the heart, such as chemotherapy drugs. °· Have diabetes. °? High blood sugar (glucose) is associated with high fat (lipid) levels in the blood. °? Diabetes  can also damage tiny blood vessels that carry nutrients to the heart muscle. °· Have abnormal heart rhythms. °· Have thyroid problems. °· Have low blood counts (anemia). ° °What are the signs or symptoms? °Symptoms of this condition include: °· Shortness of breath with activity, such as when climbing stairs. °· Persistent cough. °· Swelling of the feet, ankles, legs, or abdomen. °· Unexplained weight gain. °· Difficulty breathing when lying flat (orthopnea). °· Waking from sleep because of the need to sit up and get more air. °· Rapid heartbeat. °· Fatigue and loss of energy. °· Feeling light-headed, dizzy, or close to fainting. °· Loss of appetite. °· Nausea. °· Increased urination during the night (nocturia). °· Confusion. ° °How is this diagnosed? °This condition is diagnosed based on: °· Medical history, symptoms, and a physical exam. °· Diagnostic tests, which may include: °? Echocardiogram. °? Electrocardiogram (ECG). °? Chest X-ray. °? Blood tests. °? Exercise stress test. °? Radionuclide scans. °? Cardiac catheterization and angiogram. ° °How is this treated? °Treatment for this condition is aimed at managing the symptoms of heart failure. Medicines, behavioral changes, or other treatments may be necessary to treat heart failure. °Medicines °These may include: °· Angiotensin-converting enzyme (ACE) inhibitors. This type of medicine blocks the effects of a blood protein called angiotensin-converting enzyme. ACE inhibitors relax (dilate) the blood vessels and help to lower blood pressure. °· Angiotensin receptor blockers (ARBs). This type of medicine blocks the actions of a blood protein called angiotensin. ARBs dilate the blood vessels and help to lower blood pressure. °· Water   pills (diuretics). Diuretics cause the kidneys to remove salt and water from the blood. The extra fluid is removed through urination, leaving a lower volume of blood that the heart has to pump. °· Beta blockers. These improve heart  muscle strength and they prevent the heart from beating too quickly. °· Digoxin. This increases the force of the heartbeat. ° °Healthy behavior changes °These may include: °· Reaching and maintaining a healthy weight. °· Stopping smoking or chewing tobacco. °· Eating heart-healthy foods. °· Limiting or avoiding alcohol. °· Stopping use of street drugs (illegal drugs). °· Physical activity. ° °Other treatments °These may include: °· Surgery to open blocked coronary arteries or repair damaged heart valves. °· Placement of a biventricular pacemaker to improve heart muscle function (cardiac resynchronization therapy). This device paces both the right ventricle and left ventricle. °· Placement of a device to treat serious abnormal heart rhythms (implantable cardioverter defibrillator, or ICD). °· Placement of a device to improve the pumping ability of the heart (left ventricular assist device, or LVAD). °· Heart transplant. This can cure heart failure, and it is considered for certain patients who do not improve with other therapies. ° °Follow these instructions at home: °Medicines °· Take over-the-counter and prescription medicines only as told by your health care provider. Medicines are important in reducing the workload of your heart, slowing the progression of heart failure, and improving your symptoms. °? Do not stop taking your medicine unless your health care provider told you to do that. °? Do not skip any dose of medicine. °? Refill your prescriptions before you run out of medicine. You need your medicines every day. °Eating and drinking ° °· Eat heart-healthy foods. Talk with a dietitian to make an eating plan that is right for you. °? Choose foods that contain no trans fat and are low in saturated fat and cholesterol. Healthy choices include fresh or frozen fruits and vegetables, fish, lean meats, legumes, fat-free or low-fat dairy products, and whole-grain or high-fiber foods. °? Limit salt (sodium) if  directed by your health care provider. Sodium restriction may reduce symptoms of heart failure. Ask a dietitian to recommend heart-healthy seasonings. °? Use healthy cooking methods instead of frying. Healthy methods include roasting, grilling, broiling, baking, poaching, steaming, and stir-frying. °· Limit your fluid intake if directed by your health care provider. Fluid restriction may reduce symptoms of heart failure. °Lifestyle °· Stop smoking or using chewing tobacco. Nicotine and tobacco can damage your heart and your blood vessels. Do not use nicotine gum or patches before talking to your health care provider. °· Limit alcohol intake to no more than 1 drink per day for non-pregnant women and 2 drinks per day for men. One drink equals 12 oz of beer, 5 oz of wine, or 1½ oz of hard liquor. °? Drinking more than that is harmful to your heart. Tell your health care provider if you drink alcohol several times a week. °? Talk with your health care provider about whether any level of alcohol use is safe for you. °? If your heart has already been damaged by alcohol or you have severe heart failure, drinking alcohol should be stopped completely. °· Stop use of illegal drugs. °· Lose weight if directed by your health care provider. Weight loss may reduce symptoms of heart failure. °· Do moderate physical activity if directed by your health care provider. People who are elderly and people with severe heart failure should consult with a health care provider for physical activity recommendations. °  Monitor important information °· Weigh yourself every day. Keeping track of your weight daily helps you to notice excess fluid sooner. °? Weigh yourself every morning after you urinate and before you eat breakfast. °? Wear the same amount of clothing each time you weigh yourself. °? Record your daily weight. Provide your health care provider with your weight record. °· Monitor and record your blood pressure as told by your health  care provider. °· Check your pulse as told by your health care provider. °Dealing with extreme temperatures °· If the weather is extremely hot: °? Avoid vigorous physical activity. °? Use air conditioning or fans or seek a cooler location. °? Avoid caffeine and alcohol. °? Wear loose-fitting, lightweight, and light-colored clothing. °· If the weather is extremely cold: °? Avoid vigorous physical activity. °? Layer your clothes. °? Wear mittens or gloves, a hat, and a scarf when you go outside. °? Avoid alcohol. °General instructions °· Manage other health conditions such as hypertension, diabetes, thyroid disease, or abnormal heart rhythms as told by your health care provider. °· Learn to manage stress. If you need help to do this, ask your health care provider. °· Plan rest periods when fatigued. °· Get ongoing education and support as needed. °· Participate in or seek rehabilitation as needed to maintain or improve independence and quality of life. °· Stay up to date with immunizations. Keeping current on pneumococcal and influenza immunizations is especially important to prevent respiratory infections. °· Keep all follow-up visits as told by your health care provider. This is important. °Contact a health care provider if: °· You have a rapid weight gain. °· You have increasing shortness of breath that is unusual for you. °· You are unable to participate in your usual physical activities. °· You tire easily. °· You cough more than normal, especially with physical activity. °· You have any swelling or more swelling in areas such as your hands, feet, ankles, or abdomen. °· You are unable to sleep because it is hard to breathe. °· You feel like your heart is beating quickly (palpitations). °· You become dizzy or light-headed when you stand up. °Get help right away if: °· You have difficulty breathing. °· You notice or your family notices a change in your awareness, such as having trouble staying awake or having  difficulty with concentration. °· You have pain or discomfort in your chest. °· You have an episode of fainting (syncope). °This information is not intended to replace advice given to you by your health care provider. Make sure you discuss any questions you have with your health care provider. °Document Released: 03/01/2005 Document Revised: 11/04/2015 Document Reviewed: 09/24/2015 °Elsevier Interactive Patient Education © 2017 [employer] ° °

## 2016-11-13 NOTE — Care Management Note (Addendum)
Case Management Note  Patient Details  Name: Annia BeltKarene Einstein MRN: [id number] Date of Birth: [birth year]  Subjective/Objective:    Thrombocytopenia, acute resp failure with hypoxia, CHF               Action/Plan: Discharge Planning: Spoke to pt and states she lives in [place]. States she has Medicaid to for her medications. She will follow up with her pharmacy to have her medications shipped to her son's home. She plans to go back to [facility] on Friday.     Expected Discharge Date:  11/13/16               Expected Discharge Plan:  Home/Self Care  In-House Referral:  NA  Discharge planning Services  CM Consult, Medication Assistance  Post Acute Care Choice:  NA Choice offered to:  NA  DME Arranged:  N/A DME Agency:  NA  HH Arranged:  NA HH Agency:  NA  Status of Service:  Completed, signed off  If discussed at Long Length of Stay Meetings, dates discussed:    Additional Comments:  Elliot CousinShavis, Shenique Childers Ellen, RN 11/13/2016, 6:07 PM

## 2016-11-13 NOTE — Discharge Summary (Signed)
Physician Discharge Summary  Barbara BeltKarene Velazquez ZOX:[id number] DOB: 09-Aug-1960 DOA: 11/08/2016  PCP: System, Pcp Not In  Admit date: 11/08/2016 Discharge date: 11/13/2016  Recommendations for Outpatient Follow-up:  1. Continue Lasix 40 mg daily 2. Follow up with PCP as discussed with the pt 3. Alos, spoke with the pt about platelet count and monitoring on outpt basis, she is aware that platelets are in 90's range 4. Pt also aware of her CKD and that her stage is about 3 which is what she remembered being told in [place] 5. Hold synthroid, recheck TSH level (TSH in hospital 0.019), your PCP will determine when to resume synthroid.  Discharge Diagnoses:  Active Problems:   Thrombocytopenia (HCC)   Acute respiratory failure with hypoxia (HCC)   Acute systolic CHF (congestive heart failure) (HCC)   Anemia   Hypertension   DM (diabetes mellitus), type 2 with renal complications (HCC)   AKI (acute kidney injury) (HCC)   CKD (chronic kidney disease), stage III   Acute exacerbation of CHF (congestive heart failure) (HCC)    Discharge Condition: stable   Diet recommendation: as tolerated   History of present illness:  56 year old female with past medical history of CHF, diabetes mellitus, hypertension who presented to ED with worsening shortness of breath thought to be due to acute decompensated CHF. V/Q scan showed low probability for PE so essentially negative. Cardiology has seen the pt in consultation.  Hospital Course:   Active Problems: Acute diastolic CHF / Acute respiratory failure with hypoxia (HCC) - ECHO on this admission with preserved EF - BNP on this admission in 800 range - Continue bidil and carvedilol - Continue lasix 40 mg daily   Essential hypertension - Continue coreg and bidil  Mild troponin elevation - Likely demand ischemia from acute CHF exacerbation - No further work up required   Hyperthyroidism - TSH 0.019 and free T4 1.24 - Stopped synthroid  supplementation 8/30  - Hold synthroid, recheck TSH level (TSH in hospital 0.019), your PCP will determine when to resume synthroid.  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  - Resume insulin per home regimen   Dyslipidemia associated with type 2 DM - Continue Lipitor  CKD (chronic kidney disease), stage III - Cr 2.07 and changed lasix to PO per cardio recommendations  - Cr improved with reducing lasix dose     DVT prophylaxis: SCD's Code Status: full code  Family Communication: no family at the bedside    Consultants:   Cardio   Procedures:   LE doppler 8/28 - negative for DVT  ECHO 8/28 - EF 55-60%, small mostly posterior pericardial effusion, no tamponade   Antimicrobials:   None   Signed:  Manson PasseyAlma Ferris Tally, MD  [facility] 11/13/2016, 9:53 AM  Pager #: [phone]  Time spent in minutes: more than 30 minutes   Discharge Exam: Vitals:   11/13/16 0714 11/13/16 0717  BP:    Pulse: 83   Resp: 18   Temp:    SpO2:  90%   Vitals:   11/13/16 0552 11/13/16 0554 11/13/16 0714 11/13/16 0717  BP: (!) 130/118 (!) 166/83  Pulse: 88  83   Resp: 18  18   Temp: 98.1 F (36.7 C)     TempSrc: Oral     SpO2: 95%   90%  Weight:  103 kg (227 lb 1.2 oz)    Height:        General: Pt is alert, follows commands appropriately, not in acute distress Cardiovascular: Regular rate and rhythm, S1/S2 +, no murmurs Respiratory: Clear to auscultation bilaterally, no wheezing, no crackles, no rhonchi Abdominal: Soft, non tender, non distended, bowel sounds +, no guarding Extremities: no edema, no cyanosis,  pulses palpable bilaterally DP and PT Neuro: Grossly nonfocal  Discharge Instructions  Discharge Instructions    Avoid straining    Complete by:  As directed    Call MD for:  difficulty breathing, headache or visual disturbances    Complete by:  As directed    Call MD for:  persistant nausea and vomiting    Complete by:  As directed    Call MD for:  redness, tenderness, or signs of infection (pain, swelling, redness, odor or green/yellow discharge around incision site)    Complete by:  As directed    Call MD for:  severe uncontrolled pain    Complete by:  As directed    Diet - low sodium heart healthy    Complete by:  As directed    Diet - low sodium heart healthy    Complete by:  As directed    Diet Carb Modified    Complete by:  As directed    Discharge instructions    Complete by:  As directed    Continue Lasix 40 mg daily   Heart Failure patients record your daily weight using the same scale at the same time of day    Complete by:  As directed    Increase activity slowly    Complete by:  As directed    Increase activity slowly    Complete by:  As directed    STOP any activity that causes chest pain, shortness of breath, dizziness, sweating, or exessive weakness    Complete by:  As directed      An After Visit Summary was printed and given to the patient. Allergies as of 11/13/2016   No Known Allergies     Medication List    STOP taking these medications   levothyroxine 25 MCG tablet Commonly known as:  SYNTHROID, LEVOTHROID   losartan 25 MG tablet Commonly known as:  COZAAR     TAKE these medications   ADVAIR DISKUS 250-50 MCG/DOSE Aepb Generic drug:  Fluticasone-Salmeterol Inhale 1 puff into the lungs 2 (two) times daily as needed (SOB).   albuterol 108 (90 Base) MCG/ACT inhaler Commonly known as:  PROVENTIL HFA;VENTOLIN HFA Inhale 2 puffs into the lungs every 4 (four) hours as needed for wheezing or shortness of breath.   allopurinol 100 MG  tablet Commonly known as:  ZYLOPRIM Take 1 tablet (100 mg total) by mouth daily.   aspirin 81 MG chewable tablet Chew 81 mg by mouth daily.   atorvastatin 40 MG tablet Commonly known as:  LIPITOR Take 1 tablet (40 mg total) by mouth daily.   BASAGLAR KWIKPEN 100 UNIT/ML Sopn Inject 25 Units into the skin at bedtime.   carvedilol 25 MG tablet Commonly known as:  COREG Take 2 tablets (50 mg total) by mouth 2 (two) times daily with a meal.   cholecalciferol 1000 units tablet Commonly known as:  VITAMIN D Take  1,000 Units by mouth daily.   ferrous sulfate 325 (65 FE) MG EC tablet Take 1 tablet (325 mg total) by mouth 2 (two) times daily.   furosemide 40 MG tablet Commonly known as:  LASIX Take 1 tablet (40 mg total) by mouth daily.   isosorbide-hydrALAZINE 20-37.5 MG tablet Commonly known as:  BIDIL Take 1 tablet by mouth 3 (three) times daily.   VICTOZA 18 MG/3ML Sopn Generic drug:  liraglutide Inject 1.8 mg into the skin daily.            Discharge Care Instructions        Start     Ordered   11/14/16 0000  furosemide (LASIX) 40 MG tablet  Daily     11/13/16 0952   11/13/16 0000  ADVAIR DISKUS 250-50 MCG/DOSE AEPB  2 times daily PRN     11/13/16 0952   11/13/16 0000  albuterol (PROVENTIL HFA;VENTOLIN HFA) 108 (90 Base) MCG/ACT inhaler  Every 4 hours PRN     11/13/16 0952   11/13/16 0000  allopurinol (ZYLOPRIM) 100 MG tablet  Daily     11/13/16 0952   11/13/16 0000  atorvastatin (LIPITOR) 40 MG tablet  Daily     11/13/16 0952   11/13/16 0000  carvedilol (COREG) 25 MG tablet  2 times daily with meals     11/13/16 0952   11/13/16 0000  ferrous sulfate 325 (65 FE) MG EC tablet  2 times daily     11/13/16 0952   11/13/16 0000  isosorbide-hydrALAZINE (BIDIL) 20-37.5 MG tablet  3 times daily     11/13/16 0952   11/13/16 0000  Diet - low sodium heart healthy     11/13/16 0952   11/13/16 0000  Increase activity slowly     11/13/16 0952   11/13/16 0000  Increase  activity slowly     11/13/16 0952   11/13/16 0000  Diet - low sodium heart healthy     11/13/16 4098   11/13/16 0000  Call MD for:  persistant nausea and vomiting     11/13/16 0952   11/13/16 0000  Call MD for:  severe uncontrolled pain     11/13/16 0952   11/13/16 0000  Heart Failure patients record your daily weight using the same scale at the same time of day     11/13/16 0952   11/13/16 0000  Avoid straining     11/13/16 0952   11/13/16 0000  STOP any activity that causes chest pain, shortness of breath, dizziness, sweating, or exessive weakness     11/13/16 0952   11/13/16 0000  Diet Carb Modified     11/13/16 0952   11/13/16 0000  Call MD for:  redness, tenderness, or signs of infection (pain, swelling, redness, odor or green/yellow discharge around incision site)     11/13/16 0952   11/13/16 0000  Call MD for:  difficulty breathing, headache or visual disturbances     11/13/16 0952   11/13/16 0000  Discharge instructions    Comments:  Continue Lasix 40 mg daily Hold synthroid, recheck TSH level (TSH in hospital 0.019), your PCP will determine when to resume synthroid.   11/13/16 0957         The results of significant diagnostics from this hospitalization (including imaging, microbiology, ancillary and laboratory) are listed below for reference.    Significant Diagnostic Studies: Dg Chest 2 View  Result Date: 11/08/2016 CLINICAL DATA:  CHF, dyspnea. EXAM: CHEST  2 VIEW COMPARISON:  None. FINDINGS: Cardiomegaly. Diffuse bilateral airspace opacities with small posterior pleural effusions or consistent with CHF. Superimposed pneumonia is not entirely excluded but believed less likely. No acute osseous abnormality. IMPRESSION: Cardiomegaly with small posterior pleural effusions and diffuse airspace disease consistent with CHF. Electronically Signed   By: Tollie Eth M.D.   On: 11/08/2016 23:55   Nm Pulmonary Perf And Vent  Result Date: 11/09/2016 CLINICAL DATA:  Shortness of  breath.  Chest pain. EXAM: NUCLEAR MEDICINE VENTILATION - PERFUSION LUNG SCAN TECHNIQUE: Ventilation images were obtained in multiple projections using inhaled aerosol Tc-43m DTPA. Perfusion images were obtained in multiple projections after intravenous injection of Tc-45m MAA. RADIOPHARMACEUTICALS:  32.6 mCi Technetium-87m DTPA aerosol inhalation and 4.2 mCi Technetium-28m MAA IV COMPARISON:  Chest x-ray 10/01/2016. FINDINGS: Multifocal bilateral patchy ventilation defects. No significant ventilation defects most likely secondary to bilateral airspace disease previously noted. Tiny perfusion defects present are much smaller than ventilation defects. This is a low probability for pulmonary embolus study. IMPRESSION: Low probability pulmonary embolus. Electronically Signed   By: Maisie Fus  Register   On: 11/09/2016 10:54    Microbiology: No results found for this or any previous visit (from the past 240 hour(s)).   Labs: Basic Metabolic Panel:  Recent Labs Lab 11/09/16 0512 11/10/16 0416 11/11/16 0440 11/12/16 0449 11/13/16 0604  NA 145 143 144 142 143  K 3.7 3.7 3.9 4.1 4.0  CL 113* 113* 112* 110 111  CO2 26 24 26 25 25   GLUCOSE 136* 166* 120* 139* 119*  BUN 36* 41* 46* 46* 47*  CREATININE 1.68* 1.75* 2.07* 1.86* 1.80*  CALCIUM 8.8* 8.5* 8.7* 8.5* 8.9  MG 1.8  --   --   --   --   PHOS 2.8  --   --   --   --    Liver Function Tests:  Recent Labs Lab 11/08/16 2247 11/09/16 0512  AST 28 20  ALT 35 32  ALKPHOS 94 91  BILITOT 1.1 1.0  PROT 6.3* 6.2*  ALBUMIN 3.2* 3.3*   No results for input(s): LIPASE, AMYLASE in the last 168 hours. No results for input(s): AMMONIA in the last 168 hours. CBC:  Recent Labs Lab 11/08/16 2247 11/09/16 0512 11/10/16 0416 11/11/16 0440 11/12/16 0449 11/13/16 0604  WBC 8.7 8.5 7.3 7.0 6.7 7.5  NEUTROABS 7.1  --   --   --   --   --   HGB 8.7* 8.4* 7.5* 7.4* 7.3* 8.7*  HCT 26.5* 25.8* 23.2* 21.9* 22.6* 26.6*  MCV 86.6 86.9 86.9 86.2 86.3 85.3   PLT 87* 97* 84* 84* 92* 91*   Cardiac Enzymes:  Recent Labs Lab 11/09/16 0337 11/09/16 1115 11/09/16 1532  TROPONINI 0.05* 0.04* 0.04*   BNP: BNP (last 3 results)  Recent Labs  11/08/16 2346  BNP 814.0*    ProBNP (last 3 results) No results for input(s): PROBNP in the last 8760 hours.  CBG:  Recent Labs Lab 11/12/16 0822 11/12/16 1155 11/12/16 1630 11/12/16 2134 11/13/16 0814  GLUCAP 139* 134* 146* 164* 102*

## 2016-11-29 ENCOUNTER — Emergency Department (HOSPITAL_COMMUNITY)
Admission: EM | Admit: 2016-11-29 | Discharge: 2016-11-29 | Disposition: A | Discharge status: Home / Self Care | Payer: Medicaid - Out of State | Attending: Emergency Medicine | Admitting: Emergency Medicine

## 2016-11-29 ENCOUNTER — Encounter (HOSPITAL_COMMUNITY): Payer: Self-pay

## 2016-11-29 DIAGNOSIS — I5022 Chronic systolic (congestive) heart failure: Secondary | ICD-10-CM | POA: Insufficient documentation

## 2016-11-29 DIAGNOSIS — Z794 Long term (current) use of insulin: Secondary | ICD-10-CM | POA: Insufficient documentation

## 2016-11-29 DIAGNOSIS — E1122 Type 2 diabetes mellitus with diabetic chronic kidney disease: Secondary | ICD-10-CM | POA: Insufficient documentation

## 2016-11-29 DIAGNOSIS — R197 Diarrhea, unspecified: Secondary | ICD-10-CM | POA: Diagnosis present

## 2016-11-29 DIAGNOSIS — B9789 Other viral agents as the cause of diseases classified elsewhere: Secondary | ICD-10-CM | POA: Diagnosis not present

## 2016-11-29 DIAGNOSIS — Z7982 Long term (current) use of aspirin: Secondary | ICD-10-CM | POA: Insufficient documentation

## 2016-11-29 DIAGNOSIS — N183 Chronic kidney disease, stage 3 (moderate): Secondary | ICD-10-CM | POA: Insufficient documentation

## 2016-11-29 DIAGNOSIS — I130 Hypertensive heart and chronic kidney disease with heart failure and stage 1 through stage 4 chronic kidney disease, or unspecified chronic kidney disease: Secondary | ICD-10-CM | POA: Diagnosis not present

## 2016-11-29 DIAGNOSIS — K5289 Other specified noninfective gastroenteritis and colitis: Secondary | ICD-10-CM | POA: Insufficient documentation

## 2016-11-29 DIAGNOSIS — Z79899 Other long term (current) drug therapy: Secondary | ICD-10-CM | POA: Diagnosis not present

## 2016-11-29 DIAGNOSIS — A084 Viral intestinal infection, unspecified: Secondary | ICD-10-CM

## 2016-11-29 LAB — CBC
HCT: 32.5 % — ABNORMAL LOW (ref 36.0–46.0)
Hemoglobin: 10.6 g/dL — ABNORMAL LOW (ref 12.0–15.0)
MCH: 28.6 pg (ref 26.0–34.0)
MCHC: 32.6 g/dL (ref 30.0–36.0)
MCV: 87.6 fL (ref 78.0–100.0)
PLATELETS: 131 10*3/uL — AB (ref 150–400)
RBC: 3.71 MIL/uL — ABNORMAL LOW (ref 3.87–5.11)
RDW: 16.6 % — ABNORMAL HIGH (ref 11.5–15.5)
WBC: 9 10*3/uL (ref 4.0–10.5)

## 2016-11-29 LAB — BASIC METABOLIC PANEL
ANION GAP: 10 (ref 5–15)
BUN: 41 mg/dL — ABNORMAL HIGH (ref 6–20)
CALCIUM: 9.5 mg/dL (ref 8.9–10.3)
CO2: 26 mmol/L (ref 22–32)
Chloride: 110 mmol/L (ref 101–111)
Creatinine, Ser: 1.77 mg/dL — ABNORMAL HIGH (ref 0.44–1.00)
GFR, EST AFRICAN AMERICAN: 36 mL/min — AB (ref >60)
GFR, EST NON AFRICAN AMERICAN: 31 mL/min — AB (ref >60)
GLUCOSE: 94 mg/dL (ref 65–99)
Potassium: 4.1 mmol/L (ref 3.5–5.1)
SODIUM: 146 mmol/L — AB (ref 135–145)

## 2016-11-29 MED ORDER — LOPERAMIDE HCL 2 MG PO CAPS: 2.0000 mg | ORAL_CAPSULE | Freq: Four times a day (QID) | ORAL | 0 refills | Status: DC | PRN

## 2016-11-29 NOTE — ED Provider Notes (Signed)
[facility]-EMERGENCY DEPT Provider Note   CSN: [id number] Arrival date & time: 11/29/16  1741     History   Chief Complaint Chief Complaint  Patient presents with  . Diarrhea    HPI Barbara Velazquez is a 56 y.o. female.  HPI  56 y.o. female with a hx of CHF, DM, HTN, presents to the Emergency Department today due to diarrhea x 1 week. Recent DC from hospital on 11-13-16 due to CHF exacerbation. Pt denies abdominal pain. No fevers. No N/V. No CP/SOB. Pt called PCP in [place] and was told to go to nearest UC or ER to check electrolytes. Pt states that the diarrhea occurs every other day. Notes fully formed stools during days in between. Days with diarrhea she notes 5-6 loose bowel movements. No hematochezia. No melena. No abdominal pain. No recent ABX usage. No other symptoms noted.   Past Medical History:  Diagnosis Date  . CHF (congestive heart failure) (HCC)   . Diabetes mellitus without complication (HCC)   . Hypertension     Patient Active Problem List   Diagnosis Date Noted  . Thrombocytopenia (HCC) 11/09/2016  . Acute respiratory failure with hypoxia (HCC) 11/09/2016  . Acute systolic CHF (congestive heart failure) (HCC) 11/09/2016  . Anemia 11/09/2016  . Hypertension 11/09/2016  . 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 AKI (acute kidney injury) (HCC) 11/09/2016  . CKD (chronic kidney disease), stage III 11/09/2016  . Acute exacerbation of CHF (congestive heart failure) (HCC) 11/09/2016    Past Surgical History:  Procedure Laterality Date  . CHOLECYSTECTOMY      OB History    No data available       Home Medications    Prior to Admission medications   Medication Sig Start Date End Date Taking? Authorizing Provider  ADVAIR DISKUS 250-50 MCG/DOSE AEPB Inhale 1 puff into the lungs 2 (two) times daily as needed (SOB). 11/13/16   Alison Murray, MD  albuterol (PROVENTIL HFA;VENTOLIN HFA) 108 (90 Base) MCG/ACT inhaler Inhale 2  puffs into the lungs every 4 (four) hours as needed for wheezing or shortness of breath. 11/13/16   Alison Murray, MD  allopurinol (ZYLOPRIM) 100 MG tablet Take 1 tablet (100 mg total) by mouth daily. 11/13/16   Alison Murray, MD  aspirin 81 MG chewable tablet Chew 81 mg by mouth daily.    [provider]  atorvastatin (LIPITOR) 40 MG tablet Take 1 tablet (40 mg total) by mouth daily. 11/13/16   Alison Murray, MD  carvedilol (COREG) 25 MG tablet Take 2 tablets (50 mg total) by mouth 2 (two) times daily with a meal. 11/13/16   Alison Murray, MD  cholecalciferol (VITAMIN D) 1000 units tablet Take 1,000 Units by mouth daily.    [provider]  ferrous sulfate 325 (65 FE) MG EC tablet Take 1 tablet (325 mg total) by mouth 2 (two) times daily. 11/13/16   Alison Murray, MD  furosemide (LASIX) 40 MG tablet Take 1 tablet (40 mg total) by mouth daily. 11/14/16   Alison Murray, MD  Insulin Glargine [facility]) 100 UNIT/ML SOPN Inject 25 Units into the skin at bedtime.    [provider]  isosorbide-hydrALAZINE (BIDIL) 20-37.5 MG tablet Take 1 tablet by mouth 3 (three) times daily. 11/13/16   Alison Murray, MD  liraglutide (VICTOZA) 18 MG/3ML SOPN Inject 1.8 mg into the skin daily.    [provider]  Family History Family History  Problem Relation Age of Onset  . Diabetes Mother   . Hypertension Mother   . Breast cancer Mother   . Diabetes Father   . Hypertension Father   . Pancreatic cancer Father   . Stroke Neg Hx     Social History Social History  Substance Use Topics  . Smoking status: Never Smoker  . Smokeless tobacco: Never Used  . Alcohol use No     Allergies   Patient has no known allergies.   Review of Systems Review of Systems ROS reviewed and all are negative for acute change except as noted in the HPI.  Physical Exam Updated Vital Signs BP (!) 162/95   Pulse 95   Wt 103 kg (227 lb)   SpO2 100%   BMI 35.55 kg/m   Physical Exam   Constitutional: She is oriented to person, place, and time. She appears well-developed and well-nourished. No distress.  HENT:  Head: Normocephalic and atraumatic.  Right Ear: Tympanic membrane, external ear and ear canal normal.  Left Ear: Tympanic membrane, external ear and ear canal normal.  Nose: Nose normal.  Mouth/Throat: Uvula is midline, oropharynx is clear and moist and mucous membranes are normal. No trismus in the jaw. No oropharyngeal exudate, posterior oropharyngeal erythema or tonsillar abscesses.  Eyes: Pupils are equal, round, and reactive to light. EOM are normal.  Neck: Normal range of motion. Neck supple. No tracheal deviation present.  Cardiovascular: Normal rate, regular rhythm, S1 normal, S2 normal, normal heart sounds, intact distal pulses and normal pulses.   Pulmonary/Chest: Effort normal and breath sounds normal. No respiratory distress. She has no decreased breath sounds. She has no wheezes. She has no rhonchi. She has no rales.  Abdominal: Normal appearance and bowel sounds are normal. There is no tenderness. There is no rigidity, no rebound, no guarding, no CVA tenderness, no tenderness at McBurney's point and negative Murphy's sign.  Musculoskeletal: Normal range of motion.  Neurological: She is alert and oriented to person, place, and time.  Skin: Skin is warm and dry.  Psychiatric: She has a normal mood and affect. Her speech is normal and behavior is normal. Thought content normal.     ED Treatments / Results  Labs (all labs ordered are listed, but only abnormal results are displayed) Labs Reviewed  CBC - Abnormal; Notable for the following:       Result Value   RBC 3.71 (*)    Hemoglobin 10.6 (*)    HCT 32.5 (*)    RDW 16.6 (*)    Platelets 131 (*)    All other components within normal limits  BASIC METABOLIC PANEL - Abnormal; Notable for the following:    Sodium 146 (*)    BUN 41 (*)    Creatinine, Ser 1.77 (*)    GFR calc non Af Amer 31 (*)     GFR calc Af Amer 36 (*)    All other components within normal limits    EKG  EKG Interpretation None       Radiology No results found.  Procedures Procedures (including critical care time)  Medications Ordered in ED Medications - No data to display   Initial Impression / Assessment and Plan / ED Course  I have reviewed the triage vital signs and the nursing notes.  Pertinent labs & imaging results that were available during my care of the patient were reviewed by me and considered in my medical decision making (see chart for details).  Final Clinical Impressions(s) / ED Diagnoses  {I have reviewed and evaluated the relevant laboratory values.   {I have reviewed the relevant previous healthcare records.  {I obtained HPI from historian.   ED Course:  Assessment: Pt is a 56 y.o. female  with a hx of CHF, DM, HTN, presents to the Emergency Department today due to diarrhea x 1 week. Recent DC from hospital on 11-13-16 due to CHF exacerbation. Pt denies abdominal pain. No fevers. No N/V. No CP/SOB. Pt called PCP in [place] and was told to go to nearest UC or ER to check electrolytes. Pt states that the diarrhea occurs every other day. Notes fully formed stools during days in between. Days with diarrhea she notes 5-6 loose bowel movements. No hematochezia. No melena. No abdominal pain. No recent ABX usage. On exam, pt in NAD. Nontoxic/nonseptic appearing. VSS. Afebrile. Lungs CTA. Heart RRR. Abdomen nontender soft. CBC unremarkable. BMP unremarkable. Doubt C diff given symptoms and no recent ABX usage. Likely viral etiology Given Imodium as well as encouraged BRAT diet. Plan is to DC home with follow up to PCP. At time of discharge, Patient is in no acute distress. Vital Signs are stable. Patient is able to ambulate. Patient able to tolerate PO.   Disposition/Plan:  DC home Additional Verbal discharge instructions given and discussed with patient.  Pt Instructed to f/u with PCP in  the next week for evaluation and treatment of symptoms. Return precautions given Pt acknowledges and agrees with plan  Supervising Physician Gwyneth Sprout, MD  Final diagnoses:  Viral enteritis    New Prescriptions New Prescriptions   No medications on file     Wilber Bihari 11/29/16 2042    Gwyneth Sprout, MD 12/05/16 [phone]

## 2016-11-29 NOTE — ED Notes (Signed)
Bed: WA24 Expected date:  Expected time:  Means of arrival:  Comments: EMS 

## 2016-11-29 NOTE — ED Notes (Signed)
Discharge instructions reviewed with patient. Patient verbalizes understanding. VSS.   

## 2016-11-29 NOTE — Discharge Instructions (Signed)
Please read and follow all provided instructions.  Your diagnoses today include:  1. Viral enteritis     Tests performed today include: Vital signs. See below for your results today.   Medications prescribed:  Take as prescribed   Home care instructions:  Follow any educational materials contained in this packet.  Follow-up instructions: Please follow-up with your primary care provider for further evaluation of symptoms and treatment   Return instructions:  Please return to the Emergency Department if you do not get better, if you get worse, or new symptoms OR  - Fever (temperature greater than 101.90F)  - Bleeding that does not stop with holding pressure to the area    -Severe pain (please note that you may be more sore the day after your accident)  - Chest Pain  - Difficulty breathing  - Severe nausea or vomiting  - Inability to tolerate food and liquids  - Passing out  - Skin becoming red around your wounds  - Change in mental status (confusion or lethargy)  - New numbness or weakness    Please return if you have any other emergent concerns.  Additional Information:  Your vital signs today were: BP (!) 162/95    Pulse 95    Wt 103 kg (227 lb)    SpO2 100%    BMI 35.55 kg/m  If your blood pressure (BP) was elevated above 135/85 this visit, please have this repeated by your doctor within one month. ---------------

## 2016-11-29 NOTE — ED Triage Notes (Signed)
Patient BIB EMS from home with c/o diarrhea x1 week. Per report, patient was seen at [facility] 8/27 and was treated for CHF and Resp. Failure. Patient denies abdominal pain, weakness, fever, nausea/vomiting, SOB. VSS for EMS.

## 2017-11-03 ENCOUNTER — Emergency Department (HOSPITAL_COMMUNITY): Payer: Medicaid - Out of State

## 2017-11-03 ENCOUNTER — Other Ambulatory Visit: Payer: Self-pay

## 2017-11-03 ENCOUNTER — Ambulatory Visit (HOSPITAL_COMMUNITY): Payer: Medicaid - Out of State

## 2017-11-03 ENCOUNTER — Inpatient Hospital Stay (HOSPITAL_COMMUNITY)
Admission: EM | Admit: 2017-11-03 | Discharge: 2017-11-05 | DRG: 291 | Disposition: A | Discharge status: Home / Self Care | Payer: Medicaid - Out of State | Attending: Internal Medicine | Admitting: Internal Medicine

## 2017-11-03 ENCOUNTER — Encounter (HOSPITAL_COMMUNITY): Payer: Self-pay | Admitting: *Deleted

## 2017-11-03 DIAGNOSIS — I130 Hypertensive heart and chronic kidney disease with heart failure and stage 1 through stage 4 chronic kidney disease, or unspecified chronic kidney disease: Principal | ICD-10-CM | POA: Diagnosis present

## 2017-11-03 DIAGNOSIS — E119 Type 2 diabetes mellitus without complications: Secondary | ICD-10-CM | POA: Diagnosis present

## 2017-11-03 DIAGNOSIS — I10 Essential (primary) hypertension: Secondary | ICD-10-CM

## 2017-11-03 DIAGNOSIS — N184 Chronic kidney disease, stage 4 (severe): Secondary | ICD-10-CM | POA: Diagnosis present

## 2017-11-03 DIAGNOSIS — J810 Acute pulmonary edema: Secondary | ICD-10-CM

## 2017-11-03 DIAGNOSIS — E1122 Type 2 diabetes mellitus with diabetic chronic kidney disease: Secondary | ICD-10-CM

## 2017-11-03 DIAGNOSIS — Z794 Long term (current) use of insulin: Secondary | ICD-10-CM

## 2017-11-03 DIAGNOSIS — D696 Thrombocytopenia, unspecified: Secondary | ICD-10-CM | POA: Diagnosis present

## 2017-11-03 DIAGNOSIS — E039 Hypothyroidism, unspecified: Secondary | ICD-10-CM | POA: Diagnosis present

## 2017-11-03 DIAGNOSIS — Z803 Family history of malignant neoplasm of breast: Secondary | ICD-10-CM

## 2017-11-03 DIAGNOSIS — E875 Hyperkalemia: Secondary | ICD-10-CM | POA: Diagnosis present

## 2017-11-03 DIAGNOSIS — D649 Anemia, unspecified: Secondary | ICD-10-CM | POA: Diagnosis present

## 2017-11-03 DIAGNOSIS — I5033 Acute on chronic diastolic (congestive) heart failure: Secondary | ICD-10-CM | POA: Diagnosis present

## 2017-11-03 DIAGNOSIS — J9601 Acute respiratory failure with hypoxia: Secondary | ICD-10-CM | POA: Diagnosis present

## 2017-11-03 DIAGNOSIS — D631 Anemia in chronic kidney disease: Secondary | ICD-10-CM | POA: Diagnosis present

## 2017-11-03 DIAGNOSIS — Z800 Family history of malignant neoplasm of digestive organs: Secondary | ICD-10-CM

## 2017-11-03 DIAGNOSIS — Z9049 Acquired absence of other specified parts of digestive tract: Secondary | ICD-10-CM

## 2017-11-03 DIAGNOSIS — I509 Heart failure, unspecified: Secondary | ICD-10-CM

## 2017-11-03 DIAGNOSIS — E785 Hyperlipidemia, unspecified: Secondary | ICD-10-CM | POA: Diagnosis present

## 2017-11-03 DIAGNOSIS — Z7982 Long term (current) use of aspirin: Secondary | ICD-10-CM

## 2017-11-03 DIAGNOSIS — Z833 Family history of diabetes mellitus: Secondary | ICD-10-CM

## 2017-11-03 DIAGNOSIS — G4733 Obstructive sleep apnea (adult) (pediatric): Secondary | ICD-10-CM | POA: Diagnosis present

## 2017-11-03 DIAGNOSIS — I160 Hypertensive urgency: Secondary | ICD-10-CM | POA: Diagnosis present

## 2017-11-03 DIAGNOSIS — N183 Chronic kidney disease, stage 3 (moderate): Secondary | ICD-10-CM

## 2017-11-03 DIAGNOSIS — Z9111 Patient's noncompliance with dietary regimen: Secondary | ICD-10-CM

## 2017-11-03 DIAGNOSIS — Z8249 Family history of ischemic heart disease and other diseases of the circulatory system: Secondary | ICD-10-CM

## 2017-11-03 LAB — BRAIN NATRIURETIC PEPTIDE: B Natriuretic Peptide: 447.4 pg/mL — ABNORMAL HIGH (ref 0.0–100.0)

## 2017-11-03 LAB — CBC WITH DIFFERENTIAL/PLATELET
ABS IMMATURE GRANULOCYTES: 0.1 10*3/uL (ref 0.0–0.1)
BASOS PCT: 1 %
Basophils Absolute: 0 10*3/uL (ref 0.0–0.1)
Eosinophils Absolute: 0.2 10*3/uL (ref 0.0–0.7)
Eosinophils Relative: 3 %
HCT: 30.4 % — ABNORMAL LOW (ref 36.0–46.0)
HEMOGLOBIN: 9.3 g/dL — AB (ref 12.0–15.0)
IMMATURE GRANULOCYTES: 2 %
LYMPHS PCT: 14 %
Lymphs Abs: 0.9 10*3/uL (ref 0.7–4.0)
MCH: 29.3 pg (ref 26.0–34.0)
MCHC: 30.6 g/dL (ref 30.0–36.0)
MCV: 95.9 fL (ref 78.0–100.0)
MONO ABS: 0.3 10*3/uL (ref 0.1–1.0)
MONOS PCT: 4 %
NEUTROS ABS: 4.7 10*3/uL (ref 1.7–7.7)
NEUTROS PCT: 76 %
PLATELETS: 72 10*3/uL — AB (ref 150–400)
RBC: 3.17 MIL/uL — ABNORMAL LOW (ref 3.87–5.11)
RDW: 16.4 % — ABNORMAL HIGH (ref 11.5–15.5)
WBC: 6.1 10*3/uL (ref 4.0–10.5)

## 2017-11-03 LAB — I-STAT TROPONIN, ED: TROPONIN I, POC: 0.03 ng/mL (ref 0.00–0.08)

## 2017-11-03 LAB — I-STAT CHEM 8, ED
BUN: 57 mg/dL — AB (ref 6–20)
Calcium, Ion: 1.2 mmol/L (ref 1.15–1.40)
Chloride: 116 mmol/L — ABNORMAL HIGH (ref 98–111)
Creatinine, Ser: 2.3 mg/dL — ABNORMAL HIGH (ref 0.44–1.00)
Glucose, Bld: 214 mg/dL — ABNORMAL HIGH (ref 70–99)
HCT: 28 % — ABNORMAL LOW (ref 36.0–46.0)
HEMOGLOBIN: 9.5 g/dL — AB (ref 12.0–15.0)
POTASSIUM: 5.2 mmol/L — AB (ref 3.5–5.1)
SODIUM: 144 mmol/L (ref 135–145)
TCO2: 20 mmol/L — AB (ref 22–32)

## 2017-11-03 LAB — RAPID URINE DRUG SCREEN, HOSP PERFORMED
Amphetamines: NOT DETECTED
Barbiturates: NOT DETECTED
Benzodiazepines: NOT DETECTED
Cocaine: NOT DETECTED
Opiates: NOT DETECTED
Tetrahydrocannabinol: NOT DETECTED

## 2017-11-03 LAB — COMPREHENSIVE METABOLIC PANEL
ALBUMIN: 3.3 g/dL — AB (ref 3.5–5.0)
ALT: 28 U/L (ref 0–44)
AST: 17 U/L (ref 15–41)
Alkaline Phosphatase: 92 U/L (ref 38–126)
Anion gap: 6 (ref 5–15)
BUN: 59 mg/dL — ABNORMAL HIGH (ref 6–20)
CHLORIDE: 116 mmol/L — AB (ref 98–111)
CO2: 23 mmol/L (ref 22–32)
CREATININE: 2.28 mg/dL — AB (ref 0.44–1.00)
Calcium: 9.1 mg/dL (ref 8.9–10.3)
GFR calc non Af Amer: 22 mL/min — ABNORMAL LOW (ref >60)
GFR, EST AFRICAN AMERICAN: 26 mL/min — AB (ref >60)
GLUCOSE: 164 mg/dL — AB (ref 70–99)
Potassium: 4.7 mmol/L (ref 3.5–5.1)
SODIUM: 145 mmol/L (ref 135–145)
Total Bilirubin: 1.2 mg/dL (ref 0.3–1.2)
Total Protein: 5.9 g/dL — ABNORMAL LOW (ref 6.5–8.1)

## 2017-11-03 LAB — URINALYSIS, ROUTINE W REFLEX MICROSCOPIC
BACTERIA UA: NONE SEEN
Bilirubin Urine: NEGATIVE
GLUCOSE, UA: NEGATIVE mg/dL
KETONES UR: NEGATIVE mg/dL
Leukocytes, UA: NEGATIVE
NITRITE: NEGATIVE
PROTEIN: 100 mg/dL — AB
Specific Gravity, Urine: 1.008 (ref 1.005–1.030)
pH: 5 (ref 5.0–8.0)

## 2017-11-03 LAB — CBG MONITORING, ED
GLUCOSE-CAPILLARY: 172 mg/dL — AB (ref 70–99)
Glucose-Capillary: 165 mg/dL — ABNORMAL HIGH (ref 70–99)

## 2017-11-03 LAB — GLUCOSE, CAPILLARY: GLUCOSE-CAPILLARY: 161 mg/dL — AB (ref 70–99)

## 2017-11-03 LAB — HEMOGLOBIN A1C
HEMOGLOBIN A1C: 6.3 % — AB (ref 4.8–5.6)
Mean Plasma Glucose: 134.11 mg/dL

## 2017-11-03 LAB — TROPONIN I
Troponin I: <0.03 ng/mL (ref <0.03)
Troponin I: <0.03 ng/mL (ref <0.03)

## 2017-11-03 MED ORDER — ATORVASTATIN CALCIUM 40 MG PO TABS
40.0000 mg | ORAL_TABLET | Freq: Every day | ORAL | Status: DC
  Administered 2017-11-04 – 2017-11-05 (×2): 40 mg via ORAL
  Filled 2017-11-03 (×2): qty 1

## 2017-11-03 MED ORDER — CARVEDILOL 25 MG PO TABS
50.0000 mg | ORAL_TABLET | Freq: Two times a day (BID) | ORAL | Status: DC
  Administered 2017-11-04 – 2017-11-05 (×3): 50 mg via ORAL
  Filled 2017-11-03 (×3): qty 2

## 2017-11-03 MED ORDER — SODIUM CHLORIDE 0.9% FLUSH
3.0000 mL | Freq: Two times a day (BID) | INTRAVENOUS | Status: DC
  Administered 2017-11-03 – 2017-11-05 (×5): 3 mL via INTRAVENOUS

## 2017-11-03 MED ORDER — ASPIRIN 81 MG PO CHEW
81.0000 mg | CHEWABLE_TABLET | Freq: Every day | ORAL | Status: DC
  Administered 2017-11-04 – 2017-11-05 (×2): 81 mg via ORAL
  Filled 2017-11-03 (×2): qty 1

## 2017-11-03 MED ORDER — ALBUTEROL SULFATE (2.5 MG/3ML) 0.083% IN NEBU
2.5000 mg | INHALATION_SOLUTION | RESPIRATORY_TRACT | Status: DC | PRN
Start: 2017-11-03 — End: 2017-11-05

## 2017-11-03 MED ORDER — VITAMIN D 1000 UNITS PO TABS
1000.0000 [IU] | ORAL_TABLET | Freq: Two times a day (BID) | ORAL | Status: DC
  Administered 2017-11-03 – 2017-11-05 (×4): 1000 [IU] via ORAL
  Filled 2017-11-03 (×4): qty 1

## 2017-11-03 MED ORDER — INSULIN ASPART 100 UNIT/ML ~~LOC~~ SOLN
0.0000 [IU] | Freq: Three times a day (TID) | SUBCUTANEOUS | Status: DC
  Administered 2017-11-03 – 2017-11-04 (×4): 2 [IU] via SUBCUTANEOUS
  Administered 2017-11-05: 1 [IU] via SUBCUTANEOUS
  Filled 2017-11-03: qty 1

## 2017-11-03 MED ORDER — ASPIRIN EC 81 MG PO TBEC: 81.0000 mg | DELAYED_RELEASE_TABLET | Freq: Every day | ORAL | Status: DC

## 2017-11-03 MED ORDER — ISOSORB DINITRATE-HYDRALAZINE 20-37.5 MG PO TABS
1.0000 | ORAL_TABLET | Freq: Three times a day (TID) | ORAL | Status: DC
  Administered 2017-11-03 – 2017-11-05 (×5): 1 via ORAL
  Filled 2017-11-03 (×7): qty 1

## 2017-11-03 MED ORDER — SODIUM CHLORIDE 0.9% FLUSH: 3.0000 mL | INTRAVENOUS | Status: DC | PRN

## 2017-11-03 MED ORDER — ACETAMINOPHEN 325 MG PO TABS: 650.0000 mg | ORAL_TABLET | ORAL | Status: DC | PRN

## 2017-11-03 MED ORDER — HYDROXYZINE HCL 10 MG PO TABS
10.0000 mg | ORAL_TABLET | Freq: Three times a day (TID) | ORAL | Status: DC | PRN
  Filled 2017-11-03: qty 1

## 2017-11-03 MED ORDER — FUROSEMIDE 10 MG/ML IJ SOLN
40.0000 mg | Freq: Once | INTRAMUSCULAR | Status: AC
  Administered 2017-11-03: 40 mg via INTRAVENOUS
  Filled 2017-11-03: qty 4

## 2017-11-03 MED ORDER — ONDANSETRON HCL 4 MG/2ML IJ SOLN: 4.0000 mg | Freq: Four times a day (QID) | INTRAMUSCULAR | Status: DC | PRN

## 2017-11-03 MED ORDER — NITROGLYCERIN 2 % TD OINT
1.0000 [in_us] | TOPICAL_OINTMENT | Freq: Once | TRANSDERMAL | Status: AC
  Administered 2017-11-03: 1 [in_us] via TOPICAL
  Filled 2017-11-03: qty 1

## 2017-11-03 MED ORDER — SODIUM CHLORIDE 0.9 % IV SOLN: 250.0000 mL | INTRAVENOUS | Status: DC | PRN

## 2017-11-03 MED ORDER — ZOLPIDEM TARTRATE 5 MG PO TABS: 5.0000 mg | ORAL_TABLET | Freq: Every evening | ORAL | Status: DC | PRN

## 2017-11-03 MED ORDER — INSULIN GLARGINE 100 UNIT/ML ~~LOC~~ SOLN
20.0000 [IU] | Freq: Every day | SUBCUTANEOUS | Status: DC
  Administered 2017-11-03 – 2017-11-04 (×2): 20 [IU] via SUBCUTANEOUS
  Filled 2017-11-03 (×3): qty 0.2

## 2017-11-03 MED ORDER — FERROUS SULFATE 325 (65 FE) MG PO TABS
325.0000 mg | ORAL_TABLET | Freq: Two times a day (BID) | ORAL | Status: DC
  Administered 2017-11-03 – 2017-11-04 (×2): 325 mg via ORAL
  Filled 2017-11-03 (×3): qty 1

## 2017-11-03 MED ORDER — ALLOPURINOL 100 MG PO TABS
100.0000 mg | ORAL_TABLET | Freq: Every day | ORAL | Status: DC
  Administered 2017-11-04 – 2017-11-05 (×2): 100 mg via ORAL
  Filled 2017-11-03 (×2): qty 1

## 2017-11-03 MED ORDER — HYDRALAZINE HCL 20 MG/ML IJ SOLN
5.0000 mg | INTRAMUSCULAR | Status: DC | PRN
  Administered 2017-11-03: 5 mg via INTRAVENOUS
  Filled 2017-11-03: qty 1

## 2017-11-03 MED ORDER — LOPERAMIDE HCL 2 MG PO CAPS: 2.0000 mg | ORAL_CAPSULE | Freq: Four times a day (QID) | ORAL | Status: DC | PRN

## 2017-11-03 MED ORDER — FUROSEMIDE 10 MG/ML IJ SOLN
40.0000 mg | Freq: Two times a day (BID) | INTRAMUSCULAR | Status: DC
  Administered 2017-11-03 – 2017-11-05 (×5): 40 mg via INTRAVENOUS
  Filled 2017-11-03 (×5): qty 4

## 2017-11-03 MED ORDER — AMLODIPINE BESYLATE 5 MG PO TABS
5.0000 mg | ORAL_TABLET | Freq: Every day | ORAL | Status: DC
  Administered 2017-11-04: 5 mg via ORAL
  Filled 2017-11-03: qty 1

## 2017-11-03 NOTE — ED Provider Notes (Signed)
[facility] EMERGENCY DEPARTMENT Provider Note   CSN: [id number] Arrival date & time: 11/03/17  0023     History   Chief Complaint Chief Complaint  Patient presents with  . Respiratory Distress    HPI Barbara Velazquez is a 57 y.o. female.  The history is provided by the EMS personnel. The history is limited by the condition of the patient.  Shortness of Breath  This is a recurrent problem. The average episode lasts 1 day. The problem occurs continuously.The current episode started 12 to 24 hours ago. The problem has been gradually worsening. Associated symptoms include wheezing and leg swelling. Pertinent negatives include no abdominal pain. The problem's precipitants include medical treatment. Treatments tried: nebs en route. The treatment provided no relief.    Past Medical History:  Diagnosis Date  . CHF (congestive heart failure) (HCC)   . Diabetes mellitus without complication (HCC)   . Hypertension     Patient Active Problem List   Diagnosis Date Noted  . Thrombocytopenia (HCC) 11/09/2016  . Acute respiratory failure with hypoxia (HCC) 11/09/2016  . Acute systolic CHF (congestive heart failure) (HCC) 11/09/2016  . Anemia 11/09/2016  . Hypertension 11/09/2016  . 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 AKI (acute kidney injury) (HCC) 11/09/2016  . CKD (chronic kidney disease), stage III (HCC) 11/09/2016  . Acute exacerbation of CHF (congestive heart failure) (HCC) 11/09/2016    Past Surgical History:  Procedure Laterality Date  . CHOLECYSTECTOMY       OB History   None      Home Medications    Prior to Admission medications   Medication Sig Start Date End Date Taking? Authorizing Provider  ADVAIR DISKUS 250-50 MCG/DOSE AEPB Inhale 1 puff into the lungs 2 (two) times daily as needed (SOB). Patient taking differently: Inhale 1 puff into the lungs 2 (two) times daily.  11/13/16   Alison Murray, MD    albuterol (PROVENTIL HFA;VENTOLIN HFA) 108 (90 Base) MCG/ACT inhaler Inhale 2 puffs into the lungs every 4 (four) hours as needed for wheezing or shortness of breath. 11/13/16   Alison Murray, MD  allopurinol (ZYLOPRIM) 100 MG tablet Take 1 tablet (100 mg total) by mouth daily. 11/13/16   Alison Murray, MD  aspirin 81 MG chewable tablet Chew 81 mg by mouth daily.    [provider]  atorvastatin (LIPITOR) 40 MG tablet Take 1 tablet (40 mg total) by mouth daily. 11/13/16   Alison Murray, MD  carvedilol (COREG) 25 MG tablet Take 2 tablets (50 mg total) by mouth 2 (two) times daily with a meal. 11/13/16   Alison Murray, MD  cholecalciferol (VITAMIN D) 1000 units tablet Take 1,000 Units by mouth 2 (two) times daily.     [provider]  ferrous sulfate 325 (65 FE) MG EC tablet Take 1 tablet (325 mg total) by mouth 2 (two) times daily. 11/13/16   Alison Murray, MD  furosemide (LASIX) 40 MG tablet Take 1 tablet (40 mg total) by mouth daily. Patient taking differently: Take 80 mg by mouth daily.  11/14/16   Alison Murray, MD  Insulin Glargine [facility]) 100 UNIT/ML SOPN Inject 25 Units into the skin at bedtime.    [provider]  IRON PO Take 1 tablet by mouth daily.    [provider]  isosorbide-hydrALAZINE (BIDIL) 20-37.5 MG tablet Take 1 tablet by mouth 3 (three) times daily. 11/13/16   Elisabeth Pigeon,  Wendi Maya, MD  liraglutide (VICTOZA) 18 MG/3ML SOPN Inject 1.8 mg into the skin every evening.     [provider]  loperamide (IMODIUM) 2 MG capsule Take 1 capsule (2 mg total) by mouth 4 (four) times daily as needed for diarrhea or loose stools. 11/29/16   Audry Pili, PA-C    Family History Family History  Problem Relation Age of Onset  . Diabetes Mother   . Hypertension Mother   . Breast cancer Mother   . Diabetes Father   . Hypertension Father   . Pancreatic cancer Father   . Stroke Neg Hx     Social History Social History   Tobacco Use  . Smoking  status: Never Smoker  . Smokeless tobacco: Never Used  Substance Use Topics  . Alcohol use: No  . Drug use: No     Allergies   Patient has no known allergies.   Review of Systems Review of Systems  Unable to perform ROS: Acuity of condition  Constitutional: Positive for diaphoresis.  Respiratory: Positive for shortness of breath and wheezing.   Cardiovascular: Positive for leg swelling.  Gastrointestinal: Negative for abdominal pain.     Physical Exam Updated Vital Signs BP (!) 165/103   Pulse 97   Resp (!) 40   SpO2 (!) 82%   Physical Exam  Constitutional: She appears well-developed and well-nourished. She appears distressed.  HENT:  Head: Normocephalic and atraumatic.  Nose: Nose normal.  Eyes: Pupils are equal, round, and reactive to light. Conjunctivae are normal.  Neck: JVD present.  Cardiovascular: Normal rate, regular rhythm, normal heart sounds and intact distal pulses.  Pulmonary/Chest: Tachypnea noted. She is in respiratory distress. She has wheezes. She has rales.  Abdominal: Soft. Bowel sounds are normal. She exhibits no mass. There is no tenderness. There is no rebound and no guarding.  Musculoskeletal: She exhibits edema. She exhibits no tenderness or deformity.  Neurological: She is alert. She displays normal reflexes.  Skin: Skin is warm. Capillary refill takes less than 2 seconds.  Psychiatric:  unable     ED Treatments / Results  Labs (all labs ordered are listed, but only abnormal results are displayed) Results for orders placed or performed during the hospital encounter of 11/03/17  I-Stat Chem 8, ED  Result Value Ref Range   Sodium 144 135 - 145 mmol/L   Potassium 5.2 (H) 3.5 - 5.1 mmol/L   Chloride 116 (H) 98 - 111 mmol/L   BUN 57 (H) 6 - 20 mg/dL   Creatinine, Ser 1.61 (H) 0.44 - 1.00 mg/dL   Glucose, Bld 096 (H) 70 - 99 mg/dL   Calcium, Ion 0.45 1.15 - 1.40 mmol/L   TCO2 20 (L) 22 - 32 mmol/L   Hemoglobin 9.5 (L) 12.0 - 15.0 g/dL    HCT 40.9 (L) 81.1 - 46.0 %   No results found.  EKG EKG Interpretation  Date/Time:  Thursday November 03 2017 00:33:16 EDT Ventricular Rate:  96 PR Interval:    QRS Duration: 91 QT Interval:  442 QTC Calculation: 562 R Axis:   60 Text Interpretation:  Sinus rhythm Anteroseptal infarct, old Prolonged QT interval Confirmed by Manford Sprong ([id number]) on 11/03/2017 12:40:54 AM   Radiology No results found.  Procedures Procedures (including critical care time)  Medications Ordered in ED Medications  furosemide (LASIX) injection 40 mg (has no administration in time range)  nitroGLYCERIN (NITROGLYN) 2 % ointment 1 inch (has no administration in time range)  furosemide (LASIX) injection 40  mg (has no administration in time range)    MDM Reviewed: previous chart, nursing note and vitals Reviewed previous: labs Interpretation: labs, ECG and x-ray (pulmonary edema by me on cxr elevated creatinine) Total time providing critical care: 75-105 minutes (bipap initiated by me on arrival). This excludes time spent performing separately reportable procedures and services. Consults: admitting MD    CRITICAL CARE Performed by: Jasmine AwePALUMBO-RASCH,Brina Umeda K Total critical care time: 75 minutes Critical care time was exclusive of separately billable procedures and treating other patients. Critical care was necessary to treat or prevent imminent or life-threatening deterioration. Critical care was time spent personally by me on the following activities: development of treatment plan with patient and/or surrogate as well as nursing, discussions with consultants, evaluation of patient's response to treatment, examination of patient, obtaining history from patient or surrogate, ordering and performing treatments and interventions, ordering and review of laboratory studies, ordering and review of radiographic studies, pulse oximetry and re-evaluation of patient's condition.  Final Clinical Impressions(s) / ED  Diagnoses   Final diagnoses:  Acute pulmonary edema (HCC)    Will admit for pulmonary edema, on bipap   Anniebell Bedore, MD 11/03/17 [id number]

## 2017-11-03 NOTE — Progress Notes (Signed)
PROGRESS NOTE  Barbara Velazquez QIO:[id number] DOB: [birth year] DOA: 11/03/2017 PCP: System, Pcp Not In  HPI/Recap of past 24 hours:  She remained on bipap since admission, she denies chest pain, no fever, no cough Persistent bilateral lower extremity pitting edema Urine output was not documented   Assessment/Plan: Principal Problem:   Acute on chronic diastolic CHF (congestive heart failure) (HCC) Active Problems:   Thrombocytopenia (HCC)   Acute respiratory failure with hypoxia (HCC)   Anemia   Hypertension   DM (diabetes mellitus), type 2 with renal complications (HCC)   CKD (chronic kidney disease), stage III (HCC)   Hyperkalemia   Acute respiratory failure with hypoxia due to acute on chronic diastolic CHF  -she is sent to ED by EMS due to sob, she is found to be hypoxic, o2 84% on room air, bp 200/100 initially, Pt labored and tachypneic on arrival, unable to speak in full sentences.  -she is put on bipap, received iv lasix and admitted to stepdown unit -troponin negative, bnp at 447, echo pending -daily weight, strict intake and output -she appear improving, will try to wean bipap, keep o2 sats above 90%  HTN urgency: -bp 200/100 initially with sob -improving,  -she has not take any oral meds since admission due to on bipap -she is on Iv lasix/prn hydralazine, -resume home bp meds and titrate when able to take oral meds ( norvasc, cozaar, zaroxolyn, coreg, lasix, bidil?)   AKI on CKDIII -bun 57/cr 2.3 on presentation, (cr baseline around 1.7 in 2018) -ua pending collection -renal dosing meds, continue sodium bicarb daily, calcitriol  -repeat lab in am  Mild hyperkalemia she is on k supplement at home which is held She received iv lasix Repeat bmp   Insulin dependent dm2 -a1c pending -she is on insulin glargine 25unit qhs and victoza qhs at home -continue insulin and adjust as needed  HLD; on statin  Anemia of chronic disease: hgb stable at baseline around  9  Thrombocytopenia: Chronic plt baseline 80-90's  Hypothyroidism:  H/o hyperthyroidism? Check tsh She is on synthroid 25mcg daily , currently held due to on bipap   OSA on home cpap, will do nightly bipap for now  Code Status: full  Family Communication: patient   Disposition Plan: remain in stepdown unit, try to wean bipap   Consultants:  none  Procedures:  bipap  Antibiotics:  none   Objective: BP (!) 156/85   Pulse 88   Resp 12   SpO2 100%   Intake/Output Summary (Last 24 hours) at 11/03/2017 1615 Last data filed at 11/03/2017 0724 Gross per 24 hour  Intake -  Output 600 ml  Net -600 ml   There were no vitals filed for this visit.  Exam: Patient is examined daily including today on 11/03/2017, exams remain the same as of yesterday except that has changed    General:  NAD, on bipap  Cardiovascular: RRR  Respiratory: CTABL  Abdomen: Soft/ND/NT, positive BS  Musculoskeletal: bilateral lower extremity pitting  Edema  Neuro: alert, oriented   Data Reviewed: Basic Metabolic Panel: Recent Labs  Lab 11/03/17 0040  NA 144  K 5.2*  CL 116*  GLUCOSE 214*  BUN 57*  CREATININE 2.30*   Liver Function Tests: No results for input(s): AST, ALT, ALKPHOS, BILITOT, PROT, ALBUMIN in the last 168 hours. No results for input(s): LIPASE, AMYLASE in the last 168 hours. No results for input(s): AMMONIA in the last 168 hours. CBC: Recent Labs  Lab 11/03/17 0035 11/03/17  0040  WBC 6.1  --   NEUTROABS 4.7  --   HGB 9.3* 9.5*  HCT 30.4* 28.0*  MCV 95.9  --   PLT 72*  --    Cardiac Enzymes:   Recent Labs  Lab 11/03/17 0354 11/03/17 0856 11/03/17 1410  TROPONINI <0.03 <0.03 <0.03   BNP (last 3 results) Recent Labs    11/08/16 2346 11/03/17 0035  BNP 814.0* 447.4*    ProBNP (last 3 results) No results for input(s): PROBNP in the last 8760 hours.  CBG: Recent Labs  Lab 11/03/17 0745 11/03/17 1120  GLUCAP 165* 172*    No results  found for this or any previous visit (from the past 240 hour(s)).   Studies: Dg Chest Portable 1 View  Result Date: 11/03/2017 CLINICAL DATA:  Shortness of breath tonight. EXAM: PORTABLE CHEST 1 VIEW COMPARISON:  Radiograph 11/08/2016 FINDINGS: Progressive cardiomegaly. Bilateral perihilar airspace opacities suspicious for pulmonary edema. Basilar assessment limited by soft tissue attenuation and portable technique. Possible left pleural effusion. No pneumothorax. IMPRESSION: Cardiomegaly with moderate pulmonary edema consistent with CHF. Electronically Signed   By: Rubye Oaks M.D.   On: 11/03/2017 00:52    Scheduled Meds: . allopurinol  100 mg Oral Daily  . amLODipine  5 mg Oral Daily  . aspirin  81 mg Oral Daily  . atorvastatin  40 mg Oral Daily  . carvedilol  50 mg Oral BID WC  . cholecalciferol  1,000 Units Oral BID  . ferrous sulfate  325 mg Oral BID  . furosemide  40 mg Intravenous Q12H  . insulin aspart  0-9 Units Subcutaneous TID WC  . insulin glargine  20 Units Subcutaneous QHS  . isosorbide-hydrALAZINE  1 tablet Oral TID  . sodium chloride flush  3 mL Intravenous Q12H    Continuous Infusions: . sodium chloride       Time spent: 35 mins I have personally reviewed and interpreted on  11/03/2017 daily labs, tele strips, imagings as discussed above under date review session and assessment and plans.  I reviewed all nursing notes, pharmacy notes, consultant notes,  vitals, pertinent old records  I have discussed plan of care as described above with RN , patient  on 11/03/2017   Albertine Grates MD, PhD  [facility] Pager [phone]. If 7PM-7AM, please contact night-coverage at [email or web address], password [facility] 11/03/2017, 4:15 PM  LOS: 0 days

## 2017-11-03 NOTE — H&P (Signed)
History and Physical    Barbara Velazquez YQM:[id number] DOB: 06-Sep-1960 DOA: 11/03/2017  Referring MD/NP/PA:   PCP: System, Pcp Not In   Patient coming from:  The patient is coming from home.  At baseline, pt is independent for most of ADL.       Chief Complaint: SOB  HPI: Barbara Velazquez is a 57 y.o. female with medical history significant of hypertension, hyperlipidemia, diabetes mellitus, dCHF, iron deficiency anemia, CKD-3, thrombocytopenia, gout, who presents with shortness of breath.  Patient states that he has worsening shortness breath in the past 2 days, which has much worsened today.  She does not have chest pain or cough.  No fever or chills.  She can barely speaking full sentences.  Patient was found to have oxygen desaturation to 82% on room air.  Blood pressure was initially elevated 200/100.  Patient states that he has gained to 3 pounds recently.  Patient does not have nausea, vomiting, diarrhea, abdominal pain, symptoms of UTI or unilateral weakness.  ED Course: pt was found to have BNP 447.4, WBC 6.1, negative troponin, worsening renal function, potassium 5.2, heart rate in 90s, tachypnea, no fever.  Chest x-ray showed pulmonary edema.  Patient is placed on stepdown for observation. BiPAP was started.  Review of Systems:   General: no fevers, chills, has body weight gain, has fatigue HEENT: no blurry vision, hearing changes or sore throat Respiratory: has dyspnea, no coughing, wheezing CV: no chest pain, no palpitations GI: no nausea, vomiting, abdominal pain, diarrhea, constipation GU: no dysuria, burning on urination, increased urinary frequency, hematuria  Ext: has leg edema Neuro: no unilateral weakness, numbness, or tingling, no vision change or hearing loss Skin: no rash, no skin tear. MSK: No muscle spasm, no deformity, no limitation of range of movement in spin Heme: No easy bruising.  Travel history: No recent long distant travel.  Allergy: No Known  Allergies  Past Medical History:  Diagnosis Date  . CHF (congestive heart failure) (HCC)   . Diabetes mellitus without complication (HCC)   . Hypertension     Past Surgical History:  Procedure Laterality Date  . CHOLECYSTECTOMY      Social History:  reports that she has never smoked. She has never used smokeless tobacco. She reports that she does not drink alcohol or use drugs.  Family History:  Family History  Problem Relation Age of Onset  . Diabetes Mother   . Hypertension Mother   . Breast cancer Mother   . Diabetes Father   . Hypertension Father   . Pancreatic cancer Father   . Stroke Neg Hx      Prior to Admission medications   Medication Sig Start Date End Date Taking? Authorizing Provider  ADVAIR DISKUS 250-50 MCG/DOSE AEPB Inhale 1 puff into the lungs 2 (two) times daily as needed (SOB). Patient taking differently: Inhale 1 puff into the lungs 2 (two) times daily.  11/13/16   Alison Murray, MD  albuterol (PROVENTIL HFA;VENTOLIN HFA) 108 (90 Base) MCG/ACT inhaler Inhale 2 puffs into the lungs every 4 (four) hours as needed for wheezing or shortness of breath. 11/13/16   Alison Murray, MD  allopurinol (ZYLOPRIM) 100 MG tablet Take 1 tablet (100 mg total) by mouth daily. 11/13/16   Alison Murray, MD  aspirin 81 MG chewable tablet Chew 81 mg by mouth daily.    [provider]  atorvastatin (LIPITOR) 40 MG tablet Take 1 tablet (40 mg total) by mouth daily. 11/13/16   Elisabeth Pigeon,  Wendi Maya, MD  carvedilol (COREG) 25 MG tablet Take 2 tablets (50 mg total) by mouth 2 (two) times daily with a meal. 11/13/16   Alison Murray, MD  cholecalciferol (VITAMIN D) 1000 units tablet Take 1,000 Units by mouth 2 (two) times daily.     [provider]  ferrous sulfate 325 (65 FE) MG EC tablet Take 1 tablet (325 mg total) by mouth 2 (two) times daily. 11/13/16   Alison Murray, MD  furosemide (LASIX) 40 MG tablet Take 1 tablet (40 mg total) by mouth daily. Patient taking differently:  Take 80 mg by mouth daily.  11/14/16   Alison Murray, MD  Insulin Glargine [facility]) 100 UNIT/ML SOPN Inject 25 Units into the skin at bedtime.    [provider]  IRON PO Take 1 tablet by mouth daily.    [provider]  isosorbide-hydrALAZINE (BIDIL) 20-37.5 MG tablet Take 1 tablet by mouth 3 (three) times daily. 11/13/16   Alison Murray, MD  liraglutide (VICTOZA) 18 MG/3ML SOPN Inject 1.8 mg into the skin every evening.     [provider]  loperamide (IMODIUM) 2 MG capsule Take 1 capsule (2 mg total) by mouth 4 (four) times daily as needed for diarrhea or loose stools. 11/29/16   Audry Pili, PA-C    Physical Exam: Vitals:   11/03/17 0608 11/03/17 0615 11/03/17 0630 11/03/17 0645  BP:  (!) 166/89 (!) 165/90 (!) 165/89  Pulse: (!) 103 95 99 98  Resp: (!) 30 (!) 30 (!) 28 (!) 31  SpO2: 94% 100% 98% 98%   General: Not in acute distress HEENT:       Eyes: PERRL, EOMI, no scleral icterus.       ENT: No discharge from the ears and nose, no pharynx injection, no tonsillar enlargement.        Neck: positive JVD, no bruit, no mass felt. Heme: No neck lymph node enlargement. Cardiac: S1/S2, RRR, No murmurs, No gallops or rubs. Respiratory: has rales, no wheezing, rhonchi or rubs. GI: Soft, nondistended, nontender, no rebound pain, no organomegaly, BS present. GU: No hematuria Ext: has mild pitting leg edema bilaterally. 2+DP/PT pulse bilaterally. Musculoskeletal: No joint deformities, No joint redness or warmth, no limitation of ROM in spin. Skin: No rashes.  Neuro: Alert, oriented X3, cranial nerves II-XII grossly intact, moves all extremities normally.  Psych: Patient is not psychotic, no suicidal or hemocidal ideation.  Labs on Admission: I have personally reviewed following labs and imaging studies  CBC: Recent Labs  Lab 11/03/17 0035 11/03/17 0040  WBC 6.1  --   NEUTROABS 4.7  --   HGB 9.3* 9.5*  HCT 30.4* 28.0*  MCV 95.9  --   PLT 72*  --     Basic Metabolic Panel: Recent Labs  Lab 11/03/17 0040  NA 144  K 5.2*  CL 116*  GLUCOSE 214*  BUN 57*  CREATININE 2.30*   GFR: CrCl cannot be calculated (Unknown ideal weight.). Liver Function Tests: No results for input(s): AST, ALT, ALKPHOS, BILITOT, PROT, ALBUMIN in the last 168 hours. No results for input(s): LIPASE, AMYLASE in the last 168 hours. No results for input(s): AMMONIA in the last 168 hours. Coagulation Profile: No results for input(s): INR, PROTIME in the last 168 hours. Cardiac Enzymes: Recent Labs  Lab 11/03/17 0354  TROPONINI <0.03   BNP (last 3 results) No results for input(s): PROBNP in the last 8760 hours. HbA1C: No results for input(s): HGBA1C in  the last 72 hours. CBG: No results for input(s): GLUCAP in the last 168 hours. Lipid Profile: No results for input(s): CHOL, HDL, LDLCALC, TRIG, CHOLHDL, LDLDIRECT in the last 72 hours. Thyroid Function Tests: No results for input(s): TSH, T4TOTAL, FREET4, T3FREE, THYROIDAB in the last 72 hours. Anemia Panel: No results for input(s): VITAMINB12, FOLATE, FERRITIN, TIBC, IRON, RETICCTPCT in the last 72 hours. Urine analysis:    Component Value Date/Time   COLORURINE YELLOW 11/09/2016 0135   APPEARANCEUR CLEAR 11/09/2016 0135   LABSPEC 1.020 11/09/2016 0135   PHURINE 5.5 11/09/2016 0135   GLUCOSEU NEGATIVE 11/09/2016 0135   HGBUR SMALL (A) 11/09/2016 0135   BILIRUBINUR NEGATIVE 11/09/2016 0135   KETONESUR NEGATIVE 11/09/2016 0135   PROTEINUR 300 (A) 11/09/2016 0135   NITRITE NEGATIVE 11/09/2016 0135   LEUKOCYTESUR TRACE (A) 11/09/2016 0135   Sepsis Labs: @LABRCNTIP (procalcitonin:4,lacticidven:4) )No results found for this or any previous visit (from the past 240 hour(s)).   Radiological Exams on Admission: Dg Chest Portable 1 View  Result Date: 11/03/2017 CLINICAL DATA:  Shortness of breath tonight. EXAM: PORTABLE CHEST 1 VIEW COMPARISON:  Radiograph 11/08/2016 FINDINGS: Progressive  cardiomegaly. Bilateral perihilar airspace opacities suspicious for pulmonary edema. Basilar assessment limited by soft tissue attenuation and portable technique. Possible left pleural effusion. No pneumothorax. IMPRESSION: Cardiomegaly with moderate pulmonary edema consistent with CHF. Electronically Signed   By: Rubye OaksMelanie  Ehinger M.D.   On: 11/03/2017 00:52     EKG: Independently reviewed.    Assessment/Plan Principal Problem:   Acute on chronic diastolic CHF (congestive heart failure) (HCC) Active Problems:   Thrombocytopenia (HCC)   Acute respiratory failure with hypoxia (HCC)   Anemia   Hypertension   DM (diabetes mellitus), type 2 with renal complications (HCC)   CKD (chronic kidney disease), stage III (HCC)   Hyperkalemia   Acute respiratory failure with hypoxia due to acute on chronic diastolic CHF (congestive heart failure) (HCC): Patient has an elevated BNP, pulmonary edema chest x-ray, consistent with CHF exacerbation.  -will admit to [facility] as inpt. -on BiPAP -Lasix 40 mg bid by IV -trop x 3 -2d echo -strict I/O's -Low salt diet -continue home Bidil  Thrombocytopenia (HCC): This is a chronic issue.  Platelets 72.  No bleeding tendency. -Follow-up with CBC  Anemia: Hemoglobin dropped slightly from 10.6- 9.5.  No bleeding. -Follow-up with CBC  HTN: Bp is elevated 200/100-->166/81 -Continue home medications: Coreg - add amlodipine 5 mg daily -On IV Lasix -IV hydralazine prn  DM (diabetes mellitus), type 2 with renal complications (HCC): Last A1c not on record. Patient is taking glargine insulin and Victoza at home -will decrease glargine insulin dose from 25- 20 units daily -SSI -Check A1c  CKD (chronic kidney disease), stage III [facility](HCC): Renal function has worsened.  Baseline creatinine 1.6-1.8, her creatinine is at 2.3, BUN 57, likely due to cardiorenal syndrome. - Follow-up renal function by BMP  Hyperkalemia: Potassium 5.2 per -Expected correction with IV  Lasix.    Inpatient status:  # Patient requires inpatient status due to high intensity of service, high risk for further deterioration and high frequency of surveillance required.  I certify that at the point of admission it is my clinical judgment that the patient will require inpatient hospital care spanning beyond 2 midnights from the point of admission.   This patient has multiple chronic comorbidities including hypertension, hyperlipidemia, diabetes mellitus, dCHF, iron deficiency anemia, CKD-3, thrombocytopenia, gout, who presents with shortness of breath. . Now patient has presenting symptoms include SOB and  elevated blood pressure . The worrisome physical exam findings include leg edema, positive JVD . The initial radiographic and laboratory data are worrisome because of elevated BNP, pulmonary edema on chest x-ray . Current medical needs: please see my assessment and plan      DVT ppx: SCd Code Status: Full code Family Communication: None at bed side.  Disposition Plan:  Anticipate discharge back to previous home environment Consults called:  none Admission status: SDU/obs   Date of Service 11/03/2017    Lorretta Harp [facility] Pager [phone]  If 7PM-7AM, please contact night-coverage [email or web address] Password [facility] 11/03/2017, 7:08 AM

## 2017-11-03 NOTE — Progress Notes (Signed)
RT placed pt on BIPAP with settings of 15/5, FIO2 50%. Pt anxious but tolerating well. RT will continue to monitor.

## 2017-11-03 NOTE — Progress Notes (Signed)
Patient transported to 6E03 without complications.

## 2017-11-03 NOTE — ED Triage Notes (Signed)
Pt from home c/o shortness of breath all day. EMS gave 10mg  albuterol and 1mg  atrovent. Initial sats 84% on room air and hypertensive at 200/100. Pt labored and tachypneic on arrival, unable to speak in full sentences.

## 2017-11-04 ENCOUNTER — Observation Stay (HOSPITAL_BASED_OUTPATIENT_CLINIC_OR_DEPARTMENT_OTHER): Payer: Medicaid - Out of State

## 2017-11-04 DIAGNOSIS — D696 Thrombocytopenia, unspecified: Secondary | ICD-10-CM | POA: Diagnosis present

## 2017-11-04 DIAGNOSIS — Z803 Family history of malignant neoplasm of breast: Secondary | ICD-10-CM | POA: Diagnosis not present

## 2017-11-04 DIAGNOSIS — I5033 Acute on chronic diastolic (congestive) heart failure: Secondary | ICD-10-CM | POA: Diagnosis present

## 2017-11-04 DIAGNOSIS — E039 Hypothyroidism, unspecified: Secondary | ICD-10-CM | POA: Diagnosis present

## 2017-11-04 DIAGNOSIS — E1122 Type 2 diabetes mellitus with diabetic chronic kidney disease: Secondary | ICD-10-CM | POA: Diagnosis present

## 2017-11-04 DIAGNOSIS — Z794 Long term (current) use of insulin: Secondary | ICD-10-CM | POA: Diagnosis not present

## 2017-11-04 DIAGNOSIS — Z800 Family history of malignant neoplasm of digestive organs: Secondary | ICD-10-CM | POA: Diagnosis not present

## 2017-11-04 DIAGNOSIS — J810 Acute pulmonary edema: Secondary | ICD-10-CM | POA: Diagnosis present

## 2017-11-04 DIAGNOSIS — Z833 Family history of diabetes mellitus: Secondary | ICD-10-CM | POA: Diagnosis not present

## 2017-11-04 DIAGNOSIS — J9601 Acute respiratory failure with hypoxia: Secondary | ICD-10-CM | POA: Diagnosis present

## 2017-11-04 DIAGNOSIS — E875 Hyperkalemia: Secondary | ICD-10-CM | POA: Diagnosis present

## 2017-11-04 DIAGNOSIS — Z7982 Long term (current) use of aspirin: Secondary | ICD-10-CM | POA: Diagnosis not present

## 2017-11-04 DIAGNOSIS — I5030 Unspecified diastolic (congestive) heart failure: Secondary | ICD-10-CM

## 2017-11-04 DIAGNOSIS — Z8249 Family history of ischemic heart disease and other diseases of the circulatory system: Secondary | ICD-10-CM | POA: Diagnosis not present

## 2017-11-04 DIAGNOSIS — D631 Anemia in chronic kidney disease: Secondary | ICD-10-CM | POA: Diagnosis present

## 2017-11-04 DIAGNOSIS — E785 Hyperlipidemia, unspecified: Secondary | ICD-10-CM | POA: Diagnosis present

## 2017-11-04 DIAGNOSIS — N183 Chronic kidney disease, stage 3 (moderate): Secondary | ICD-10-CM | POA: Diagnosis present

## 2017-11-04 DIAGNOSIS — Z9111 Patient's noncompliance with dietary regimen: Secondary | ICD-10-CM | POA: Diagnosis not present

## 2017-11-04 DIAGNOSIS — R609 Edema, unspecified: Secondary | ICD-10-CM | POA: Diagnosis not present

## 2017-11-04 DIAGNOSIS — G4733 Obstructive sleep apnea (adult) (pediatric): Secondary | ICD-10-CM | POA: Diagnosis present

## 2017-11-04 DIAGNOSIS — I160 Hypertensive urgency: Secondary | ICD-10-CM | POA: Diagnosis present

## 2017-11-04 DIAGNOSIS — I130 Hypertensive heart and chronic kidney disease with heart failure and stage 1 through stage 4 chronic kidney disease, or unspecified chronic kidney disease: Secondary | ICD-10-CM | POA: Diagnosis present

## 2017-11-04 DIAGNOSIS — Z9049 Acquired absence of other specified parts of digestive tract: Secondary | ICD-10-CM | POA: Diagnosis not present

## 2017-11-04 LAB — HEPATIC FUNCTION PANEL
ALBUMIN: 3.1 g/dL — AB (ref 3.5–5.0)
ALK PHOS: 86 U/L (ref 38–126)
ALT: 23 U/L (ref 0–44)
AST: 13 U/L — ABNORMAL LOW (ref 15–41)
BILIRUBIN TOTAL: 1 mg/dL (ref 0.3–1.2)
Bilirubin, Direct: 0.2 mg/dL (ref 0.0–0.2)
Indirect Bilirubin: 0.8 mg/dL (ref 0.3–0.9)
Total Protein: 5.7 g/dL — ABNORMAL LOW (ref 6.5–8.1)

## 2017-11-04 LAB — GLUCOSE, CAPILLARY
GLUCOSE-CAPILLARY: 156 mg/dL — AB (ref 70–99)
GLUCOSE-CAPILLARY: 166 mg/dL — AB (ref 70–99)
GLUCOSE-CAPILLARY: 166 mg/dL — AB (ref 70–99)
Glucose-Capillary: 187 mg/dL — ABNORMAL HIGH (ref 70–99)
Glucose-Capillary: 178 mg/dL — ABNORMAL HIGH (ref 70–99)

## 2017-11-04 LAB — CBC
HCT: 26.2 % — ABNORMAL LOW (ref 36.0–46.0)
Hemoglobin: 8.2 g/dL — ABNORMAL LOW (ref 12.0–15.0)
MCH: 29.5 pg (ref 26.0–34.0)
MCHC: 31.3 g/dL (ref 30.0–36.0)
MCV: 94.2 fL (ref 78.0–100.0)
PLATELETS: 124 10*3/uL — AB (ref 150–400)
RBC: 2.78 MIL/uL — AB (ref 3.87–5.11)
RDW: 16.5 % — ABNORMAL HIGH (ref 11.5–15.5)
WBC: 7.9 10*3/uL (ref 4.0–10.5)

## 2017-11-04 LAB — LIPID PANEL
Cholesterol: 160 mg/dL (ref 0–200)
HDL: 52 mg/dL (ref >40)
LDL CALC: 97 mg/dL (ref 0–99)
Total CHOL/HDL Ratio: 3.1 RATIO
Triglycerides: 54 mg/dL (ref <150)
VLDL: 11 mg/dL (ref 0–40)

## 2017-11-04 LAB — BASIC METABOLIC PANEL
Anion gap: 5 (ref 5–15)
BUN: 59 mg/dL — ABNORMAL HIGH (ref 6–20)
CALCIUM: 8.9 mg/dL (ref 8.9–10.3)
CHLORIDE: 116 mmol/L — AB (ref 98–111)
CO2: 23 mmol/L (ref 22–32)
CREATININE: 2.11 mg/dL — AB (ref 0.44–1.00)
GFR calc non Af Amer: 25 mL/min — ABNORMAL LOW (ref >60)
GFR, EST AFRICAN AMERICAN: 29 mL/min — AB (ref >60)
GLUCOSE: 194 mg/dL — AB (ref 70–99)
Potassium: 4.3 mmol/L (ref 3.5–5.1)
Sodium: 144 mmol/L (ref 135–145)

## 2017-11-04 LAB — MRSA PCR SCREENING: MRSA BY PCR: NEGATIVE

## 2017-11-04 LAB — ECHOCARDIOGRAM COMPLETE: WEIGHTICAEL: 3371.2 [oz_av]

## 2017-11-04 LAB — FOLATE: Folate: 11.4 ng/mL (ref >5.9)

## 2017-11-04 LAB — TSH: TSH: 0.284 u[IU]/mL — AB (ref 0.350–4.500)

## 2017-11-04 LAB — T4, FREE: FREE T4: 0.85 ng/dL (ref 0.82–1.77)

## 2017-11-04 LAB — VITAMIN B12: VITAMIN B 12: 557 pg/mL (ref 180–914)

## 2017-11-04 MED ORDER — LOSARTAN POTASSIUM 25 MG PO TABS
25.0000 mg | ORAL_TABLET | Freq: Every day | ORAL | Status: DC
  Administered 2017-11-05: 25 mg via ORAL
  Filled 2017-11-04: qty 1

## 2017-11-04 MED ORDER — FERROUS SULFATE 325 (65 FE) MG PO TABS
325.0000 mg | ORAL_TABLET | Freq: Every day | ORAL | Status: DC
  Administered 2017-11-05: 325 mg via ORAL
  Filled 2017-11-04: qty 1

## 2017-11-04 NOTE — Care Management Note (Signed)
Case Management Note  Patient Details  Name: Barbara Velazquez MRN: [id number] Date of Birth: June 18, 1960  Subjective/Objective: 57 y.o. female with medical history significant of hypertension, hyperlipidemia, diabetes mellitus, dCHF, iron deficiency anemia, CKD-3, thrombocytopenia, gout, who presents with shortness of breath. Patient is independent, currently living in [place] and was visiting her son who lives locally. Patient indicated her local pharmacy was [facility], [place], and has a home cpap machine. PCP: Dr. Douglass RiversWoodrow Bill of [place]              Action/Plan: CM will continue to follow for additional needs.   Expected Discharge Date:                  Expected Discharge Plan:  Home/Self Care  In-House Referral:  NA  Discharge planning Services  CM Consult  Post Acute Care Choice:  NA Choice offered to:  NA  DME Arranged:  N/A DME Agency:  NA  HH Arranged:  NA HH Agency:  NA  Status of Service:  In process, will continue to follow  If discussed at Long Length of Stay Meetings, dates discussed:    Additional Comments:  Colleen CanNatalie Omarri Eich RN, BSN, NCM-BC, ACM-RN [phone] 11/04/2017, 11:01 AM

## 2017-11-04 NOTE — Progress Notes (Signed)
  Echocardiogram 2D Echocardiogram has been performed.  Barbara SavoyCasey N Larrissa Velazquez 11/04/2017, 9:15 AM

## 2017-11-04 NOTE — Progress Notes (Signed)
Placed patient on BIPAP for the night with IPAP set at 10cm and EPAP set at 5cm. Will continue to monitor patient along with SP02 levels.

## 2017-11-04 NOTE — Progress Notes (Addendum)
PROGRESS NOTE  Barbara Velazquez QIO:[id number] DOB: 1960-08-12 DOA: 11/03/2017 PCP: System, Pcp Not In  HPI/Recap of past 24 hours:   Feeling better, she is off bipap during daytime, she is on bipap at night Blood pressure improving She remain oxygen dependent, RN reports patient's o2 sats drops to the 80's with minimal exertion like getting out of bed  She denies chest pain, no fever, bilateral lower extremity remain edematous  She reports she has urinated too much, urine output was not documented    Assessment/Plan: Principal Problem:   Acute on chronic diastolic CHF (congestive heart failure) (HCC) Active Problems:   Thrombocytopenia (HCC)   Acute respiratory failure with hypoxia (HCC)   Anemia   Hypertension   DM (diabetes mellitus), type 2 with renal complications (HCC)   CKD (chronic kidney disease), stage III (HCC)   Hyperkalemia   Acute respiratory failure with hypoxia due to acute on chronic diastolic CHF  -she is sent to ED by EMS due to sob, she is found to be hypoxic, o2 84% on room air, bp 200/100 initially, Pt labored and tachypneic on arrival, unable to speak in full sentences.  -she is put on bipap, received iv lasix and admitted to stepdown unit -troponin negative, bnp at 447, echo "LVEF 55% to 60%. Wall motion was normal" "grade 2 diastolic dysfunction" -will get venous doppler lower extremity to r/o DVT, will check ddimer -daily weight, strict intake and output -she appear improving, may be off bipap during day time, continue bipap qhs, wean oxygen , keep o2 sats above 90%  HTN urgency: -bp 200/100 initially with sob -improving,  -currently on bidil, coreg, iv lasix, resume cozaar at a lower dose, hold norvasc/zaroxylyn  for now -she reports bidil is too expensive, she is on hydralazine and imdur at home   AKI on CKDIII -bun 57/cr 2.3 on presentation, (cr baseline around 1.7 in 2018) -ua no bacteria, dose not suggest uti -renal dosing meds, continue  sodium bicarb daily, calcitriol  - bun 59/cr 2.11 today, repeat lab in am  Mild hyperkalemia, k 5.2 on presentation -she is on k supplement at home which is held -She is on iv lasix, k 4.3 today -Repeat bmp   Insulin dependent dm2 -a1c 6.3 -she is on insulin glargine 25unit qhs and victoza qhs at home -continue insulin and adjust as needed -am blood glucose 194  HLD; on statin  Anemia of chronic disease: hgb stable at baseline around 9  Thrombocytopenia: Chronic plt baseline 80-90's  Hypothyroidism:  H/o hyperthyroidism? Check tsh 0.2, free t4 0.8 Hold  Home meds synthroid daily , does not plan to resume at discharge She need to follow up with pmd to repeat tsh in 4weeks   OSA on home cpap, will do nightly bipap for now  Code Status: full  Family Communication: patient   Disposition Plan: improving ,transfer out of stepdown unit, home in 1-2 days, need to wean oxygen and access home 02 needs    Consultants:  none  Procedures:  bipap  Antibiotics:  none   Objective: BP 139/70   Pulse 89   Temp 98.2 F (36.8 C) (Oral)   Resp 18   Ht 5\' 7"  (1.702 m)   Wt 95.6 kg   SpO2 95%   BMI 33.00 kg/m   Intake/Output Summary (Last 24 hours) at 11/04/2017 1508 Last data filed at 11/04/2017 0815 Gross per 24 hour  Intake 240 ml  Output -  Net 240 ml   Ceasar Mons  Weights   11/04/17 0413 11/04/17 1429  Weight: 95.6 kg 95.6 kg    Exam: Patient is examined daily including today on 11/04/2017, exams remain the same as of yesterday except that has changed    General:  NAD,   Cardiovascular: RRR  Respiratory: CTABL  Abdomen: Soft/ND/NT, positive BS  Musculoskeletal: bilateral lower extremity pitting  Edema  Neuro: alert, oriented   Data Reviewed: Basic Metabolic Panel: Recent Labs  Lab 11/03/17 0040 11/03/17 1620 11/04/17 0405  NA 144 145 144  K 5.2* 4.7 4.3  CL 116* 116* 116*  CO2  --  23 23  GLUCOSE 214* 164* 194*  BUN 57* 59* 59*    CREATININE 2.30* 2.28* 2.11*  CALCIUM  --  9.1 8.9   Liver Function Tests: Recent Labs  Lab 11/03/17 1620 11/04/17 0405  AST 17 13*  ALT 28 23  ALKPHOS 92 86  BILITOT 1.2 1.0  PROT 5.9* 5.7*  ALBUMIN 3.3* 3.1*   No results for input(s): LIPASE, AMYLASE in the last 168 hours. No results for input(s): AMMONIA in the last 168 hours. CBC: Recent Labs  Lab 11/03/17 0035 11/03/17 0040 11/04/17 0405  WBC 6.1  --  7.9  NEUTROABS 4.7  --   --   HGB 9.3* 9.5* 8.2*  HCT 30.4* 28.0* 26.2*  MCV 95.9  --  94.2  PLT 72*  --  124*   Cardiac Enzymes:   Recent Labs  Lab 11/03/17 0354 11/03/17 0856 11/03/17 1410  TROPONINI <0.03 <0.03 <0.03   BNP (last 3 results) Recent Labs    11/08/16 2346 11/03/17 0035  BNP 814.0* 447.4*    ProBNP (last 3 results) No results for input(s): PROBNP in the last 8760 hours.  CBG: Recent Labs  Lab 11/03/17 0745 11/03/17 1120 11/03/17 1648 11/03/17 2127 11/04/17 0734  GLUCAP 165* 172* 156* 161* 166*    Recent Results (from the past 240 hour(s))  MRSA PCR Screening     Status: None   Collection Time: 11/03/17 11:59 PM  Result Value Ref Range Status   MRSA by PCR NEGATIVE NEGATIVE Final    Comment:        The GeneXpert MRSA Assay (FDA approved for NASAL specimens only), is one component of a comprehensive MRSA colonization surveillance program. It is not intended to diagnose MRSA infection nor to guide or monitor treatment for MRSA infections. Performed at [facility], [address] [place], [place] [address]      Studies: No results found.  Scheduled Meds: . allopurinol  100 mg Oral Daily  . amLODipine  5 mg Oral Daily  . aspirin  81 mg Oral Daily  . atorvastatin  40 mg Oral Daily  . carvedilol  50 mg Oral BID WC  . cholecalciferol  1,000 Units Oral BID  . ferrous sulfate  325 mg Oral BID  . furosemide  40 mg Intravenous Q12H  . insulin aspart  0-9 Units Subcutaneous TID WC  . insulin glargine  20 Units  Subcutaneous QHS  . isosorbide-hydrALAZINE  1 tablet Oral TID  . sodium chloride flush  3 mL Intravenous Q12H    Continuous Infusions: . sodium chloride       Time spent: 35 mins I have personally reviewed and interpreted on  11/04/2017 daily labs, tele strips, imagings as discussed above under date review session and assessment and plans.  I reviewed all nursing notes, pharmacy notes,  vitals, pertinent old records  I have discussed plan of care  as described above with RN , patient  on 11/04/2017   Albertine GratesFang Hoyt Leanos MD, PhD  [facility] Pager [phone]. If 7PM-7AM, please contact night-coverage at [email or web address], password [email or web address] 11/04/2017, 3:08 PM  LOS: 0 days

## 2017-11-04 NOTE — Progress Notes (Signed)
SATURATION QUALIFICATIONS: (This note is used to comply with regulatory documentation for home oxygen)  Patient Saturations on Room Air at Rest = 89%  Patient Saturations on Room Air while Ambulating = 83%  Patient Saturations on 2 Liters of oxygen while Ambulating = 98%  Please briefly explain why patient needs home oxygen: Patient desaturated while walking hallway on room air. O2 saturation was within normal limits while ambulating and wearing 2L [place].

## 2017-11-05 ENCOUNTER — Inpatient Hospital Stay (HOSPITAL_COMMUNITY): Payer: Medicaid - Out of State

## 2017-11-05 DIAGNOSIS — R609 Edema, unspecified: Secondary | ICD-10-CM

## 2017-11-05 LAB — GLUCOSE, CAPILLARY
Glucose-Capillary: 147 mg/dL — ABNORMAL HIGH (ref 70–99)
Glucose-Capillary: 204 mg/dL — ABNORMAL HIGH (ref 70–99)

## 2017-11-05 LAB — CBC WITH DIFFERENTIAL/PLATELET
ABS IMMATURE GRANULOCYTES: 0 10*3/uL (ref 0.0–0.1)
BASOS ABS: 0 10*3/uL (ref 0.0–0.1)
Basophils Relative: 0 %
Eosinophils Absolute: 0.2 10*3/uL (ref 0.0–0.7)
Eosinophils Relative: 3 %
HCT: 23.6 % — ABNORMAL LOW (ref 36.0–46.0)
HEMOGLOBIN: 7.5 g/dL — AB (ref 12.0–15.0)
IMMATURE GRANULOCYTES: 0 %
LYMPHS PCT: 12 %
Lymphs Abs: 0.8 10*3/uL (ref 0.7–4.0)
MCH: 29.9 pg (ref 26.0–34.0)
MCHC: 31.8 g/dL (ref 30.0–36.0)
MCV: 94 fL (ref 78.0–100.0)
MONOS PCT: 7 %
Monocytes Absolute: 0.5 10*3/uL (ref 0.1–1.0)
NEUTROS PCT: 78 %
Neutro Abs: 5.4 10*3/uL (ref 1.7–7.7)
PLATELETS: 117 10*3/uL — AB (ref 150–400)
RBC: 2.51 MIL/uL — ABNORMAL LOW (ref 3.87–5.11)
RDW: 16.5 % — ABNORMAL HIGH (ref 11.5–15.5)
WBC: 6.9 10*3/uL (ref 4.0–10.5)

## 2017-11-05 LAB — BASIC METABOLIC PANEL
Anion gap: 7 (ref 5–15)
BUN: 65 mg/dL — ABNORMAL HIGH (ref 6–20)
CO2: 23 mmol/L (ref 22–32)
Calcium: 8.6 mg/dL — ABNORMAL LOW (ref 8.9–10.3)
Chloride: 112 mmol/L — ABNORMAL HIGH (ref 98–111)
Creatinine, Ser: 2.53 mg/dL — ABNORMAL HIGH (ref 0.44–1.00)
GFR calc non Af Amer: 20 mL/min — ABNORMAL LOW (ref >60)
GFR, EST AFRICAN AMERICAN: 23 mL/min — AB (ref >60)
GLUCOSE: 161 mg/dL — AB (ref 70–99)
POTASSIUM: 4 mmol/L (ref 3.5–5.1)
Sodium: 142 mmol/L (ref 135–145)

## 2017-11-05 LAB — D-DIMER, QUANTITATIVE (NOT AT ARMC): D DIMER QUANT: 2.12 ug{FEU}/mL — AB (ref 0.00–0.50)

## 2017-11-05 MED ORDER — ISOSORBIDE MONONITRATE ER 30 MG PO TB24: 30.0000 mg | ORAL_TABLET | Freq: Every day | ORAL | 0 refills | Status: DC

## 2017-11-05 MED ORDER — LOSARTAN POTASSIUM 25 MG PO TABS: 25.0000 mg | ORAL_TABLET | Freq: Every day | ORAL | 0 refills | Status: DC

## 2017-11-05 MED ORDER — HYDRALAZINE HCL 25 MG PO TABS: 25.0000 mg | ORAL_TABLET | Freq: Three times a day (TID) | ORAL | 0 refills | Status: DC

## 2017-11-05 MED ORDER — POTASSIUM CHLORIDE CRYS ER 20 MEQ PO TBCR: 20.0000 meq | EXTENDED_RELEASE_TABLET | ORAL | 0 refills | Status: DC

## 2017-11-05 MED ORDER — CARVEDILOL 25 MG PO TABS: 50.0000 mg | ORAL_TABLET | Freq: Two times a day (BID) | ORAL | 0 refills | Status: DC

## 2017-11-05 MED ORDER — FUROSEMIDE 40 MG PO TABS: 40.0000 mg | ORAL_TABLET | Freq: Every day | ORAL | 0 refills | Status: DC

## 2017-11-05 NOTE — Discharge Summary (Addendum)
Discharge Summary  Barbara Velazquez ZOX:[id number] DOB: 11-Dec-1960  PCP: System, Pcp Not In  Admit date: 11/03/2017 Discharge date: 11/05/2017  Time spent: , more than 50% time spent on coordination of care.  Recommendations for Outpatient Follow-up:  1. F/u with PMD in [place] [facility] DR Richardean Canal within a week  for hospital discharge follow up, repeat cbc/bmp at follow up. Patient is advised to check blood pressure at home and bring in blood pressure record for her doctor to review, further blood pressure meds adjustment per pcp 2. F/u with cardiology Dr Courtney Paris in [facility] for heart failure management ( patient states she was just in the hospital in [facility] in 08/2017 for heart failure exacerbation) 3. F/u with nephrology as already scheduled   Discharge Diagnoses:  Active Hospital Problems   Diagnosis Date Noted  . Acute on chronic diastolic CHF (congestive heart failure) (HCC) 11/09/2016  . Hyperkalemia 11/03/2017  . Acute respiratory failure with hypoxia (HCC) 11/09/2016  . 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 Hypertension 11/09/2016  . Thrombocytopenia (HCC) 11/09/2016  . CKD (chronic kidney disease), stage III (HCC) 11/09/2016  . Anemia 11/09/2016    Resolved Hospital Problems  No resolved problems to display.    Discharge Condition: stable  Diet recommendation: heart healthy/carb modified  Filed Weights   11/04/17 0413 11/04/17 1429 11/05/17 0431  Weight: 95.6 kg 95.6 kg 95.8 kg    History of present illness: (per admitting MD Dr Clyde Lundborg) Patient coming from:  The patient is coming from home.  At baseline, pt is independent for most of ADL.       Chief Complaint: SOB  HPI: Barbara Velazquez is a 57 y.o. female with medical history significant of hypertension, hyperlipidemia, diabetes mellitus, dCHF, iron deficiency anemia, CKD-3, thrombocytopenia, gout, who presents with shortness of breath.  Patient states that he has  worsening shortness breath in the past 2 days, which has much worsened today.  She does not have chest pain or cough.  No fever or chills.  She can barely speaking full sentences.  Patient was found to have oxygen desaturation to 82% on room air.  Blood pressure was initially elevated 200/100.  Patient states that he has gained to 3 pounds recently.  Patient does not have nausea, vomiting, diarrhea, abdominal pain, symptoms of UTI or unilateral weakness.  ED Course: pt was found to have BNP 447.4, WBC 6.1, negative troponin, worsening renal function, potassium 5.2, heart rate in 90s, tachypnea, no fever.  Chest x-ray showed pulmonary edema.  Patient is placed on stepdown for observation. BiPAP was started.   Hospital Course:  Principal Problem:   Acute on chronic diastolic CHF (congestive heart failure) (HCC) Active Problems:   Thrombocytopenia (HCC)   Acute respiratory failure with hypoxia (HCC)   Anemia   Hypertension   DM (diabetes mellitus), type 2 with renal complications (HCC)   CKD (chronic kidney disease), stage III (HCC)   Hyperkalemia   Acute respiratory failure with hypoxiadue to acute on chronic diastolic CHF  -she is sent to ED by EMS due to sob, she is found to be hypoxic, o2 84% on room air, bp 200/100 initially, Pt labored and tachypneic on arrival, unable to speak in full sentences. -she is put on bipap, received iv lasix and admitted to stepdown unit -troponin negative, bnp at 447, echo "LVEF 55% to 60%. Wallmotion was normal" "grade 2 diastolic dysfunction" -she has improved quickly , she is wean off oxygen, repeat  cxr with "Near complete resolution of the pulmonary" - I suspect there is component of meds and diet noncompliance issue that contribute to frequent exacerbation, she report she was hospitalized several time this year for similar issues, last in 08/2017 in [place] -she is instructed to check blood pressure, daily weight at home , she is to follow up  with pcp and cardiology closely  Bilateral lower extremity edema -significant edema on presentation -this has almost resolved at discharge - venous doppler lower extremity negative for  DVT   HTN urgency: -bp 200/100 initially with sob --she received bidil, coreg (increased dose), iv lasix, resume cozaar at a lower dose, her blood pressure is well controlled without home meds  norvasc and zaroxolyn   -she reports bidil is too expensive, she has not been taking it -she is discharged on hydralazine tid (new), imdur daily (new), reduced dose cozaar (she was on 50mg  daily, this decreased to 25mg  daily for renal protection in diabetes), increased dose of coreg ( she was on bid, this increased to 50mg  bid) -home lasix and zaroxolyn resumed -norvasc discontinued ( side effect of edema) -she is instructed to check blood pressure at home , further bp management for pcp.   AKI on CKDIII -bun 57/cr 2.3 on presentation, (cr baseline around 1.7 in 2018) -ua no bacteria, dose not suggest uti -renal dosing meds, continue sodium bicarb daily, calcitriol  - bun 59/cr 2.11 , improved with diuresis -cozaar held on presentation, resumed at reduced dose -she reports already has an appointment with nephrology in the near future.   Mild hyperkalemia, k 5.2 on presentation -she is on k supplement at home which is held -She is on iv lasix, k 4, she did not need k supplement in the hospital while on iv lasix -she is discharged on reduced k supplement, she was on k daily, this changed to qmwf, pcp to repeat bmp at follow up. -cozaar dose reduced from 50mg  daily to 25mg  daily -home meds lasix and metolazone resume at discharge   Insulin dependent dm2 -a1c 6.3 -she is on insulin glargine 25unit qhs and victoza qhs at home, continue at discharge   HLD; on statin  Anemia of chronic disease: hgb stable at baseline around 9 No sign of bleeding  Thrombocytopenia: Chronic,  stable plt baseline fluctuating from 70's to 120's ptl on presentation was 72, at discharge is 117 Follow up with pcp  Hypothyroidism:   tsh 0.2, free t4 0.8 Hold  Home meds synthroid daily  She need to follow up with pmd to repeat tsh in 4weeks   OSA on home cpap, she get nightly bipap in the hospital Resume home cpap  Code Status: full  Family Communication: patient   Disposition Plan:home    Consultants:  none  Procedures:  bipap  Antibiotics:  none   Discharge Exam: BP (!) 150/75 (BP Location: Left Arm)   Pulse 95   Temp 98 F (36.7 C) (Axillary)   Resp 20   Ht 5\' 7"  (1.702 m)   Wt 95.8 kg   SpO2 93%   BMI 33.06 kg/m   General: NAD Cardiovascular: RRR Respiratory: CTABL   Discharge Instructions You were cared for by a hospitalist during your hospital stay. If you have any questions about your discharge medications or the care you received while you were in the hospital after you are discharged, you can call the unit and asked to speak with the hospitalist on call if the hospitalist  that took care of you is not available. Once you are discharged, your primary care physician will handle any further medical issues. Please note that NO REFILLS for any discharge medications will be authorized once you are discharged, as it is imperative that you return to your primary care physician (or establish a relationship with a primary care physician if you do not have one) for your aftercare needs so that they can reassess your need for medications and monitor your lab values.  Discharge Instructions    Diet - low sodium heart healthy   Complete by:  As directed    Carb modified diet   Discharge instructions   Complete by:  As directed    Please weight yourself daily, please call your doctor if your weight increase 3lbs in three days or your feet/ankle/legs are swollen   Increase activity slowly   Complete by:  As directed      Allergies as of  11/05/2017   No Known Allergies     Medication List    STOP taking these medications   amLODipine 10 MG tablet Commonly known as:  NORVASC   levothyroxine 25 MCG tablet Commonly known as:  SYNTHROID, LEVOTHROID     TAKE these medications   albuterol 108 (90 Base) MCG/ACT inhaler Commonly known as:  PROVENTIL HFA;VENTOLIN HFA Inhale 2 puffs into the lungs every 4 (four) hours as needed for wheezing or shortness of breath.   allopurinol 100 MG tablet Commonly known as:  ZYLOPRIM Take 1 tablet (100 mg total) by mouth daily. What changed:  when to take this   aspirin 81 MG chewable tablet Chew 81 mg by mouth daily.   atorvastatin 40 MG tablet Commonly known as:  LIPITOR Take 1 tablet (40 mg total) by mouth daily.   BASAGLAR KWIKPEN 100 UNIT/ML Sopn Inject 25 Units into the skin at bedtime.   calcitRIOL 0.25 MCG capsule Commonly known as:  ROCALTROL Take 0.25 mcg by mouth daily.   carvedilol 25 MG tablet Commonly known as:  COREG Take 2 tablets (50 mg total) by mouth 2 (two) times daily with a meal. What changed:  how much to take   cholecalciferol 1000 units tablet Commonly known as:  VITAMIN D Take 1,000 Units by mouth 2 (two) times daily.   dicyclomine 20 MG tablet Commonly known as:  BENTYL Take 20 mg by mouth as needed.   ferrous sulfate 325 (65 FE) MG EC tablet Take 1 tablet (325 mg total) by mouth 2 (two) times daily.   furosemide 40 MG tablet Commonly known as:  LASIX Take 1 tablet (40 mg total) by mouth daily. What changed:  how much to take   hydrALAZINE 25 MG tablet Commonly known as:  APRESOLINE Take 1 tablet (25 mg total) by mouth 3 (three) times daily.   isosorbide mononitrate 30 MG 24 hr tablet Commonly known as:  IMDUR Take 1 tablet (30 mg total) by mouth daily.   loperamide 2 MG capsule Commonly known as:  IMODIUM Take 1 capsule (2 mg total) by mouth 4 (four) times daily as needed for diarrhea or loose stools.   losartan 25 MG  tablet Commonly known as:  COZAAR Take 1 tablet (25 mg total) by mouth daily. Start taking on:  11/06/2017 What changed:    medication strength  how much to take   metolazone 2.5 MG tablet Commonly known as:  ZAROXOLYN Take 2.5 mg by mouth daily.   pantoprazole 40 MG tablet Commonly known as:  PROTONIX Take 40 mg by mouth daily.   potassium chloride SA 20 MEQ tablet Commonly known as:  K-DUR,KLOR-CON Take 1 tablet (20 mEq total) by mouth every Monday, Wednesday, and Friday. Start taking on:  11/07/2017 What changed:  when to take this   sodium bicarbonate 650 MG tablet Take 650 mg by mouth daily.   VICTOZA 18 MG/3ML Sopn Generic drug:  liraglutide Inject 1.8 mg into the skin every evening.      No Known Allergies Follow-up Information    f/u with your pcp Dr Richardean Canal in [facility] Follow up in 1 week(s).   Why:  hospital discharge follow up, repeat cbc/bmp at follow up. please check your blood pressure at home 1-2 tmes  a day, bring in record for Dr Alvester Morin to review and further adjust bp meds if needed repeat thyroid function Tsh test in 4 weeks       f/u with cardiology Dr Courtney Paris in [facility] Follow up in 2 day(s).            The results of significant diagnostics from this hospitalization (including imaging, microbiology, ancillary and laboratory) are listed below for reference.    Significant Diagnostic Studies: Dg Chest 2 View  Result Date: 11/05/2017 CLINICAL DATA:  Shortness of breath and congestive heart failure. EXAM: CHEST - 2 VIEW COMPARISON:  11/03/2016 FINDINGS: Radiographic improvement with near complete resolution of the pulmonary edema. Perhaps minimal residual interstitial edema. Tiny amount of pleural fluid in 1 of the posterior costophrenic angles. Chronic cardiomegaly and aortic atherosclerosis. No focal pulmonary pathology. IMPRESSION: Marked radiographic improvement. Near complete resolution of the edema. Small amount of pleural fluid in 1 of the  posterior costophrenic angles. Electronically Signed   By: Paulina Fusi M.D.   On: 11/05/2017 12:30   Dg Chest Portable 1 View  Result Date: 11/03/2017 CLINICAL DATA:  Shortness of breath tonight. EXAM: PORTABLE CHEST 1 VIEW COMPARISON:  Radiograph 11/08/2016 FINDINGS: Progressive cardiomegaly. Bilateral perihilar airspace opacities suspicious for pulmonary edema. Basilar assessment limited by soft tissue attenuation and portable technique. Possible left pleural effusion. No pneumothorax. IMPRESSION: Cardiomegaly with moderate pulmonary edema consistent with CHF. Electronically Signed   By: Rubye Oaks M.D.   On: 11/03/2017 00:52    Microbiology: Recent Results (from the past 240 hour(s))  MRSA PCR Screening     Status: None   Collection Time: 11/03/17 11:59 PM  Result Value Ref Range Status   MRSA by PCR NEGATIVE NEGATIVE Final    Comment:        The GeneXpert MRSA Assay (FDA approved for NASAL specimens only), is one component of a comprehensive MRSA colonization surveillance program. It is not intended to diagnose MRSA infection nor to guide or monitor treatment for MRSA infections. Performed at [facility], [address], [place], [place] [address]      Labs: Basic Metabolic Panel: Recent Labs  Lab 11/03/17 0040 11/03/17 1620 11/04/17 0405 11/05/17 0427  NA 144 145 144 142  K 5.2* 4.7 4.3 4.0  CL 116* 116* 116* 112*  CO2  --  23 23 23   GLUCOSE 214* 164* 194* 161*  BUN 57* 59* 59* 65*  CREATININE 2.30* 2.28* 2.11* 2.53*  CALCIUM  --  9.1 8.9 8.6*   Liver Function Tests: Recent Labs  Lab 11/03/17 1620 11/04/17 0405  AST 17 13*  ALT 28 23  ALKPHOS 92 86  BILITOT 1.2 1.0  PROT 5.9* 5.7*  ALBUMIN 3.3* 3.1*   No results  for input(s): LIPASE, AMYLASE in the last 168 hours. No results for input(s): AMMONIA in the last 168 hours. CBC: Recent Labs  Lab 11/03/17 0035 11/03/17 0040 11/04/17 0405 11/05/17 0427  WBC 6.1  --  7.9 6.9  NEUTROABS 4.7   --   --  5.4  HGB 9.3* 9.5* 8.2* 7.5*  HCT 30.4* 28.0* 26.2* 23.6*  MCV 95.9  --  94.2 94.0  PLT 72*  --  124* 117*   Cardiac Enzymes: Recent Labs  Lab 11/03/17 0354 11/03/17 0856 11/03/17 1410  TROPONINI <0.03 <0.03 <0.03   BNP: BNP (last 3 results) Recent Labs    11/08/16 2346 11/03/17 0035  BNP 814.0* 447.4*    ProBNP (last 3 results) No results for input(s): PROBNP in the last 8760 hours.  CBG: Recent Labs  Lab 11/04/17 1141 11/04/17 1649 11/04/17 2154 11/05/17 0729 11/05/17 1114  GLUCAP 187* 178* 166* 147* 204*       Signed:  Albertine GratesFang Harumi Yamin MD, PhD  [facility] 11/05/2017, 1:28 PM

## 2017-11-05 NOTE — Progress Notes (Signed)
IV removed. Discharge paperwork discussed with pt. Given paper prescriptions. Pt awaiting for daughter in law to be picked up.   Leonidas Rombergaitlin S Bumbledare, RN

## 2017-11-05 NOTE — Discharge Instructions (Signed)
Heart Failure °Heart failure means your heart has trouble pumping blood. This makes it hard for your body to work well. Heart failure is usually a long-term (chronic) condition. You must take good care of yourself and follow your doctor's treatment plan. °Follow these instructions at home: °· Take your heart medicine as told by your doctor. °? Do not stop taking medicine unless your doctor tells you to. °? Do not skip any dose of medicine. °? Refill your medicines before they run out. °? Take other medicines only as told by your doctor or pharmacist. °· Stay active if told by your doctor. The elderly and people with severe heart failure should talk with a doctor about physical activity. °· Eat heart-healthy foods. Choose foods that are without trans fat and are low in saturated fat, cholesterol, and salt (sodium). This includes fresh or frozen fruits and vegetables, fish, lean meats, fat-free or low-fat dairy foods, whole grains, and high-fiber foods. Lentils and dried peas and beans (legumes) are also good choices. °· Limit salt if told by your doctor. °· Cook in a healthy way. Roast, grill, broil, bake, poach, steam, or stir-fry foods. °· Limit fluids as told by your doctor. °· Weigh yourself every morning. Do this after you pee (urinate) and before you eat breakfast. Write down your weight to give to your doctor. °· Take your blood pressure and write it down if your doctor tells you to. °· Ask your doctor how to check your pulse. Check your pulse as told. °· Lose weight if told by your doctor. °· Stop smoking or chewing tobacco. Do not use gum or patches that help you quit without your doctor's approval. °· Schedule and go to doctor visits as told. °· Nonpregnant women should have no more than 1 drink a day. Men should have no more than 2 drinks a day. Talk to your doctor about drinking alcohol. °· Stop illegal drug use. °· Stay current with shots (immunizations). °· Manage your health conditions as told by your  doctor. °· Learn to manage your stress. °· Rest when you are tired. °· If it is really hot outside: °? Avoid intense activities. °? Use air conditioning or fans, or get in a cooler place. °? Avoid caffeine and alcohol. °? Wear loose-fitting, lightweight, and light-colored clothing. °· If it is really cold outside: °? Avoid intense activities. °? Layer your clothing. °? Wear mittens or gloves, a hat, and a scarf when going outside. °? Avoid alcohol. °· Learn about heart failure and get support as needed. °· Get help to maintain or improve your quality of life and your ability to care for yourself as needed. °Contact a doctor if: °· You gain weight quickly. °· You are more short of breath than usual. °· You cannot do your normal activities. °· You tire easily. °· You cough more than normal, especially with activity. °· You have any or more puffiness (swelling) in areas such as your hands, feet, ankles, or belly (abdomen). °· You cannot sleep because it is hard to breathe. °· You feel like your heart is beating fast (palpitations). °· You get dizzy or light-headed when you stand up. °Get help right away if: °· You have trouble breathing. °· There is a change in mental status, such as becoming less alert or not being able to focus. °· You have chest pain or discomfort. °· You faint. °This information is not intended to replace advice given to you by your health care provider. Make sure you   discuss any questions you have with your health care provider. °Document Released: 12/09/2007 Document Revised: 08/07/2015 Document Reviewed: 04/17/2012 °Elsevier Interactive Patient Education © 2017 [employer] Inc[employer] ° ° ° °Cooking With Less Salt °Cooking with less salt is one way to reduce the amount of sodium you get from food. Depending on your condition and overall health, your health care provider or diet and nutrition specialist (dietitian) may recommend that you reduce your sodium intake. Most people should have less than 2,300  milligrams (mg) of sodium each day. If you have high blood pressure (hypertension), you may need to limit your sodium to 1,500 mg each day. Follow the tips below to help reduce your sodium intake. °What do I need to know about cooking with less salt? °Shopping °· Buy sodium-free or low-sodium products. Look for the following words on food labels: °? Low-sodium. °? Sodium-free. °? Reduced-sodium. °? No salt added. °? Unsalted. °· Buy fresh or frozen vegetables. Avoid canned vegetables. °· Avoid buying meats or protein foods that have been injected with broth or saline solution. °· Avoid cured or smoked meats, such as hot dogs, bacon, salami, ham, and bologna. °Reading food labels °· Check the food label before buying or using packaged ingredients. °· Look for products with no more than 140 mg of sodium in one serving. °· Do not choose foods with salt as one of the first three ingredients on the ingredients list. If salt is one of the first three ingredients, it usually means the item is high in sodium, because ingredients are listed in order of amount in the food item. °Cooking °· Use herbs, seasonings without salt, and spices as substitutes for salt in foods. °· Use sodium-free baking soda when baking. °· Grill, braise, or roast foods to add flavor with less salt. °· Avoid adding salt to pasta, rice, or hot cereals while cooking. °· Drain and rinse canned vegetables before use. °· Avoid adding salt when cooking sweets and desserts. °· Cook with low-sodium ingredients. °What are some salt alternatives? °The following are herbs, seasonings, and spices that can be used instead of salt to give taste to your food. Herbs should be fresh or dried. Do not choose packaged mixes. Next to the name of the herb, spice, or seasoning are some examples of foods you can pair it with. °Herbs °· Bay leaves - Soups, meat and vegetable dishes, and spaghetti sauce. °· Basil - Italian dishes, soups, pasta, and fish dishes. °· Cilantro -  Meat, poultry, and vegetable dishes. °· Chili powder - Marinades and Mexican dishes. °· Chives - Salad dressings and potato dishes. °· Cumin - Mexican dishes, couscous, and meat dishes. °· Dill - Fish dishes, sauces, and salads. °· Fennel - Meat and vegetable dishes, breads, and cookies. °· Garlic (do not use garlic salt) - Italian dishes, meat dishes, salad dressings, and sauces. °· Marjoram - Soups, potato dishes, and meat dishes. °· Oregano - Pizza and spaghetti sauce. °· Parsley - Salads, soups, pasta, and meat dishes. °· Rosemary - Italian dishes, salad dressings, soups, and red meats. °· Saffron - Fish dishes, pasta, and some poultry dishes. °· Sage - Stuffings and sauces. °· Tarragon - Fish and poultry dishes. °· Thyme - Stuffing, meat, and fish dishes. °Seasonings °· Lemon juice - Fish dishes, poultry dishes, vegetables, and salads. °· Vinegar - Salad dressings, vegetables, and fish dishes. °Spices °· Cinnamon - Sweet dishes, such as cakes, cookies, and puddings. °· Cloves - Gingerbread, puddings, and marinades for meats. °· Curry -   Vegetable dishes, fish and poultry dishes, and stir-fry dishes. °· Ginger - Vegetables dishes, fish dishes, and stir-fry dishes. °· Nutmeg - Pasta, vegetables, poultry, fish dishes, and custard. °What are some low-sodium ingredients and foods? °· Fresh or frozen fruits and vegetables with no sauce added. °· Fresh or frozen whole meats, poultry, and fish with no sauce added. °· Eggs. °· Noodles, pasta, quinoa, rice. °· Shredded or puffed wheat or puffed rice. °· Regular or quick oats. °· Milk, yogurt, hard cheeses, and low-sodium cheeses. Good cheese choices include Swiss, Monterey Jack, and mozzarella. Always check the label for the serving size and sodium content. °· Unsalted butter or margarine. °· Unsalted nuts. °· Sherbet or ice cream (keep to ½ cup per serving). °· Homemade pudding. °· Sodium-free baking soda and baking powder. °This is not a complete list of low-sodium  ingredients and foods. Contact your dietitian for more options. °Summary °· Cooking with less salt is one way to reduce the amount of sodium that you get from food. °· Buy sodium-free or low-sodium products. °· Check the food label before using or buying packaged ingredients. °· Use herbs, seasonings without salt, and spices as substitutes for salt in foods. °This information is not intended to replace advice given to you by your health care provider. Make sure you discuss any questions you have with your health care provider. °Document Released: 03/01/2005 Document Revised: 03/09/2016 Document Reviewed: 03/09/2016 °Elsevier Interactive Patient Education © 2017 Elsevier Inc. ° °

## 2017-11-05 NOTE — Progress Notes (Signed)
Pt ambulated 470 feet on room air. O2 saturations remained above 93% for duration of walk. Pt tolerated well. Denies SOB, DOE.   Spoke with [facility] US tech on call. Alerted that she is awaiting LE duplex prior to discharge. They said they would get to her as soon as possible.   Transportation arrive to take pt to xray. Pt updated as to plan of care (CXR then US then discharge).   Leonidas Rombergaitlin S Bumbledare, RN

## 2017-11-05 NOTE — Progress Notes (Signed)
VASCULAR LAB PRELIMINARY  PRELIMINARY  PRELIMINARY  PRELIMINARY  Bilateral lower extremity venous duplex completed.    Preliminary report:  There is no DVT or SVT noted in the bilateral lower extremities.   Melodi Happel, RVT 11/05/2017, 1:22 PM

## 2017-12-21 ENCOUNTER — Encounter (HOSPITAL_COMMUNITY): Payer: Self-pay | Admitting: *Deleted

## 2017-12-21 ENCOUNTER — Emergency Department (HOSPITAL_COMMUNITY): Payer: Self-pay

## 2017-12-21 ENCOUNTER — Emergency Department (HOSPITAL_COMMUNITY)
Admission: EM | Admit: 2017-12-21 | Discharge: 2017-12-21 | Disposition: A | Discharge status: Home / Self Care | Payer: Self-pay | Attending: Emergency Medicine | Admitting: Emergency Medicine

## 2017-12-21 DIAGNOSIS — I130 Hypertensive heart and chronic kidney disease with heart failure and stage 1 through stage 4 chronic kidney disease, or unspecified chronic kidney disease: Secondary | ICD-10-CM | POA: Insufficient documentation

## 2017-12-21 DIAGNOSIS — Z794 Long term (current) use of insulin: Secondary | ICD-10-CM | POA: Insufficient documentation

## 2017-12-21 DIAGNOSIS — N183 Chronic kidney disease, stage 3 (moderate): Secondary | ICD-10-CM | POA: Insufficient documentation

## 2017-12-21 DIAGNOSIS — Z7982 Long term (current) use of aspirin: Secondary | ICD-10-CM | POA: Insufficient documentation

## 2017-12-21 DIAGNOSIS — E1122 Type 2 diabetes mellitus with diabetic chronic kidney disease: Secondary | ICD-10-CM | POA: Insufficient documentation

## 2017-12-21 DIAGNOSIS — Z79899 Other long term (current) drug therapy: Secondary | ICD-10-CM | POA: Insufficient documentation

## 2017-12-21 DIAGNOSIS — I5023 Acute on chronic systolic (congestive) heart failure: Secondary | ICD-10-CM | POA: Insufficient documentation

## 2017-12-21 LAB — CBC WITH DIFFERENTIAL/PLATELET
BASOS ABS: 0.1 10*3/uL (ref 0.0–0.1)
BASOS PCT: 1 %
Eosinophils Absolute: 0.2 10*3/uL (ref 0.0–0.5)
Eosinophils Relative: 3 %
HEMATOCRIT: 33.6 % — AB (ref 36.0–46.0)
Hemoglobin: 10.5 g/dL — ABNORMAL LOW (ref 12.0–15.0)
LYMPHS PCT: 14 %
Lymphs Abs: 0.8 10*3/uL (ref 0.7–4.0)
MCH: 29.2 pg (ref 26.0–34.0)
MCHC: 31.3 g/dL (ref 30.0–36.0)
MCV: 93.3 fL (ref 80.0–100.0)
MONO ABS: 0.5 10*3/uL (ref 0.1–1.0)
Monocytes Relative: 8 %
NEUTROS ABS: 4.4 10*3/uL (ref 1.7–7.7)
Neutrophils Relative %: 74 %
PLATELETS: 97 10*3/uL — AB (ref 150–400)
RBC: 3.6 MIL/uL — AB (ref 3.87–5.11)
RDW: 15.9 % — ABNORMAL HIGH (ref 11.5–15.5)
WBC: 5.9 10*3/uL (ref 4.0–10.5)

## 2017-12-21 LAB — URINALYSIS, ROUTINE W REFLEX MICROSCOPIC
BILIRUBIN URINE: NEGATIVE
GLUCOSE, UA: 150 mg/dL — AB
Ketones, ur: NEGATIVE mg/dL
NITRITE: NEGATIVE
Protein, ur: >=300 mg/dL — AB
SPECIFIC GRAVITY, URINE: 1.014 (ref 1.005–1.030)
WBC, UA: >50 WBC/hpf — ABNORMAL HIGH (ref 0–5)
pH: 6 (ref 5.0–8.0)

## 2017-12-21 LAB — COMPREHENSIVE METABOLIC PANEL
ALT: 28 U/L (ref 0–44)
AST: 18 U/L (ref 15–41)
Albumin: 3 g/dL — ABNORMAL LOW (ref 3.5–5.0)
Alkaline Phosphatase: 104 U/L (ref 38–126)
Anion gap: 8 (ref 5–15)
BUN: 35 mg/dL — ABNORMAL HIGH (ref 6–20)
CALCIUM: 8.9 mg/dL (ref 8.9–10.3)
CO2: 25 mmol/L (ref 22–32)
Chloride: 113 mmol/L — ABNORMAL HIGH (ref 98–111)
Creatinine, Ser: 2.09 mg/dL — ABNORMAL HIGH (ref 0.44–1.00)
GFR calc non Af Amer: 25 mL/min — ABNORMAL LOW (ref >60)
GFR, EST AFRICAN AMERICAN: 29 mL/min — AB (ref >60)
Glucose, Bld: 197 mg/dL — ABNORMAL HIGH (ref 70–99)
POTASSIUM: 3.4 mmol/L — AB (ref 3.5–5.1)
SODIUM: 146 mmol/L — AB (ref 135–145)
TOTAL PROTEIN: 5.6 g/dL — AB (ref 6.5–8.1)
Total Bilirubin: 1.1 mg/dL (ref 0.3–1.2)

## 2017-12-21 LAB — I-STAT CHEM 8, ED
BUN: 35 mg/dL — AB (ref 6–20)
CREATININE: 2.2 mg/dL — AB (ref 0.44–1.00)
Calcium, Ion: 1.15 mmol/L (ref 1.15–1.40)
Chloride: 110 mmol/L (ref 98–111)
Glucose, Bld: 198 mg/dL — ABNORMAL HIGH (ref 70–99)
HCT: 31 % — ABNORMAL LOW (ref 36.0–46.0)
Hemoglobin: 10.5 g/dL — ABNORMAL LOW (ref 12.0–15.0)
POTASSIUM: 3.5 mmol/L (ref 3.5–5.1)
Sodium: 147 mmol/L — ABNORMAL HIGH (ref 135–145)
TCO2: 24 mmol/L (ref 22–32)

## 2017-12-21 LAB — I-STAT TROPONIN, ED: Troponin i, poc: 0.04 ng/mL (ref 0.00–0.08)

## 2017-12-21 LAB — BRAIN NATRIURETIC PEPTIDE: B Natriuretic Peptide: 1439.6 pg/mL — ABNORMAL HIGH (ref 0.0–100.0)

## 2017-12-21 LAB — I-STAT CG4 LACTIC ACID, ED: Lactic Acid, Venous: 1.39 mmol/L (ref 0.5–1.9)

## 2017-12-21 LAB — TSH: TSH: 0.955 u[IU]/mL (ref 0.350–4.500)

## 2017-12-21 MED ORDER — POTASSIUM CHLORIDE CRYS ER 20 MEQ PO TBCR
40.0000 meq | EXTENDED_RELEASE_TABLET | Freq: Once | ORAL | Status: AC
  Administered 2017-12-21: 40 meq via ORAL
  Filled 2017-12-21: qty 2

## 2017-12-21 MED ORDER — MAGNESIUM OXIDE 400 (241.3 MG) MG PO TABS
800.0000 mg | ORAL_TABLET | Freq: Once | ORAL | Status: AC
  Administered 2017-12-21: 800 mg via ORAL
  Filled 2017-12-21: qty 2

## 2017-12-21 MED ORDER — HYDRALAZINE HCL 25 MG PO TABS
25.0000 mg | ORAL_TABLET | Freq: Once | ORAL | Status: AC
  Administered 2017-12-21: 25 mg via ORAL
  Filled 2017-12-21: qty 1

## 2017-12-21 MED ORDER — NITROGLYCERIN 0.4 MG SL SUBL: 0.4000 mg | SUBLINGUAL_TABLET | SUBLINGUAL | Status: DC | PRN

## 2017-12-21 MED ORDER — FUROSEMIDE 10 MG/ML IJ SOLN
40.0000 mg | Freq: Once | INTRAMUSCULAR | Status: AC
  Administered 2017-12-21: 40 mg via INTRAVENOUS
  Filled 2017-12-21: qty 4

## 2017-12-21 MED ORDER — CARVEDILOL 12.5 MG PO TABS
25.0000 mg | ORAL_TABLET | Freq: Once | ORAL | Status: AC
  Administered 2017-12-21: 25 mg via ORAL
  Filled 2017-12-21: qty 2

## 2017-12-21 MED ORDER — LOSARTAN POTASSIUM 25 MG PO TABS
25.0000 mg | ORAL_TABLET | Freq: Once | ORAL | Status: AC
  Administered 2017-12-21: 25 mg via ORAL
  Filled 2017-12-21: qty 1

## 2017-12-21 NOTE — Discharge Instructions (Signed)
Double your lasix for the next 4-5 days.  Call your cardiologist in the morning.  Return for worsening sob, or difficulty breathing.

## 2017-12-21 NOTE — ED Notes (Signed)
Ambulated pt in hall. No pain or dizziness. O2 94 the whole time

## 2017-12-21 NOTE — ED Notes (Signed)
Patient verbalizes understanding of discharge instructions. Opportunity for questioning and answers were provided. Armband removed by staff, pt discharged from ED.  

## 2017-12-21 NOTE — ED Provider Notes (Signed)
[facility] EMERGENCY DEPARTMENT Provider Note   CSN: [id number] Arrival date & time: 12/21/17  1211     History   Chief Complaint Chief Complaint  Patient presents with  . Leg Swelling    HPI Barbara Velazquez is a 57 y.o. female.  57 yo F with PMH of CHF, HTN, HLD, DM2, Hyperthyroid.  Here with blisters and leg swelling bilaterally.  L>R since Friday.  Denies fevers, chills, erythema.  Some SOB, started about the same time.  Doesn't think she has gained weight.  Denies CP.  Feels similar to prior HF exacerbation.  Denies hx of PE/DVT.    The history is provided by the patient.  Illness  This is a new problem. The current episode started 2 days ago. The problem occurs constantly. The problem has not changed since onset.Associated symptoms include shortness of breath. Pertinent negatives include no chest pain and no headaches. Nothing aggravates the symptoms. Nothing relieves the symptoms. She has tried nothing for the symptoms. The treatment provided no relief.    Past Medical History:  Diagnosis Date  . CHF (congestive heart failure) (HCC)   . Diabetes mellitus without complication (HCC)   . Hypertension     Patient Active Problem List   Diagnosis Date Noted  . Hyperkalemia 11/03/2017  . Thrombocytopenia (HCC) 11/09/2016  . Acute respiratory failure with hypoxia (HCC) 11/09/2016  . Acute systolic CHF (congestive heart failure) (HCC) 11/09/2016  . Anemia 11/09/2016  . Hypertension 11/09/2016  . 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 AKI (acute kidney injury) (HCC) 11/09/2016  . CKD (chronic kidney disease), stage III (HCC) 11/09/2016  . Acute on chronic diastolic CHF (congestive heart failure) (HCC) 11/09/2016    Past Surgical History:  Procedure Laterality Date  . CHOLECYSTECTOMY       OB History   None      Home Medications    Prior to Admission medications   Medication Sig Start Date End Date Taking?  Authorizing Provider  albuterol (PROVENTIL HFA;VENTOLIN HFA) 108 (90 Base) MCG/ACT inhaler Inhale 2 puffs into the lungs every 4 (four) hours as needed for wheezing or shortness of breath. 11/13/16   Alison Murray, MD  allopurinol (ZYLOPRIM) 100 MG tablet Take 1 tablet (100 mg total) by mouth daily. Patient taking differently: Take 100 mg by mouth 2 (two) times daily.  11/13/16   Alison Murray, MD  aspirin 81 MG chewable tablet Chew 81 mg by mouth daily.    [provider]  atorvastatin (LIPITOR) 40 MG tablet Take 1 tablet (40 mg total) by mouth daily. 11/13/16   Alison Murray, MD  calcitRIOL (ROCALTROL) 0.25 MCG capsule Take 0.25 mcg by mouth daily. 09/12/17   [provider]  carvedilol (COREG) 25 MG tablet Take 2 tablets (50 mg total) by mouth 2 (two) times daily with a meal. 11/05/17   Albertine Grates, MD  cholecalciferol (VITAMIN D) 1000 units tablet Take 1,000 Units by mouth 2 (two) times daily.     [provider]  dicyclomine (BENTYL) 20 MG tablet Take 20 mg by mouth as needed. 07/25/17   [provider]  ferrous sulfate 325 (65 FE) MG EC tablet Take 1 tablet (325 mg total) by mouth 2 (two) times daily. 11/13/16   Alison Murray, MD  furosemide (LASIX) 40 MG tablet Take 1 tablet (40 mg total) by mouth daily. 11/05/17   Albertine Grates, MD  hydrALAZINE (APRESOLINE) 25 MG tablet Take  1 tablet (25 mg total) by mouth 3 (three) times daily. 11/05/17 12/05/17  Albertine Grates, MD  Insulin Glargine [facility] KWIKPEN) 100 UNIT/ML SOPN Inject 25 Units into the skin at bedtime.    [provider]  isosorbide mononitrate (IMDUR) 30 MG 24 hr tablet Take 1 tablet (30 mg total) by mouth daily. 11/05/17 11/05/18  Albertine Grates, MD  liraglutide (VICTOZA) 18 MG/3ML SOPN Inject 1.8 mg into the skin every evening.     [provider]  loperamide (IMODIUM) 2 MG capsule Take 1 capsule (2 mg total) by mouth 4 (four) times daily as needed for diarrhea or loose stools. 11/29/16   Audry Pili, PA-C    losartan (COZAAR) 25 MG tablet Take 1 tablet (25 mg total) by mouth daily. 11/06/17   Albertine Grates, MD  metolazone (ZAROXOLYN) 2.5 MG tablet Take 2.5 mg by mouth daily. 10/11/17   [provider]  pantoprazole (PROTONIX) 40 MG tablet Take 40 mg by mouth daily. 10/03/17   [provider]  potassium chloride SA (K-DUR,KLOR-CON) 20 MEQ tablet Take 1 tablet (20 mEq total) by mouth every Monday, Wednesday, and Friday. 11/07/17   Albertine Grates, MD  sodium bicarbonate 650 MG tablet Take 650 mg by mouth daily. 10/03/17   [provider]    Family History Family History  Problem Relation Age of Onset  . Diabetes Mother   . Hypertension Mother   . Breast cancer Mother   . Diabetes Father   . Hypertension Father   . Pancreatic cancer Father   . Stroke Neg Hx     Social History Social History   Tobacco Use  . Smoking status: Never Smoker  . Smokeless tobacco: Never Used  Substance Use Topics  . Alcohol use: No  . Drug use: No     Allergies   Patient has no known allergies.   Review of Systems Review of Systems  Constitutional: Negative for chills and fever.  HENT: Negative for congestion and rhinorrhea.   Eyes: Negative for redness and visual disturbance.  Respiratory: Positive for shortness of breath. Negative for wheezing.   Cardiovascular: Positive for leg swelling. Negative for chest pain and palpitations.  Gastrointestinal: Negative for nausea and vomiting.  Genitourinary: Negative for dysuria and urgency.  Musculoskeletal: Negative for arthralgias and myalgias.  Skin: Negative for pallor and wound.  Neurological: Negative for dizziness and headaches.     Physical Exam Updated Vital Signs BP (!) 188/80 (BP Location: Right Arm)   Pulse 91   Temp 97.7 F (36.5 C) (Oral)   Resp 18   Ht 5\' 7"  (1.702 m)   Wt 97.1 kg   SpO2 98%   BMI 33.52 kg/m   Physical Exam  Constitutional: She is oriented to person, place, and time. She appears well-developed and  well-nourished. No distress.  HENT:  Head: Normocephalic and atraumatic.  Eyes: Pupils are equal, round, and reactive to light. EOM are normal.  Neck: Normal range of motion. Neck supple. JVD (just above the clavicles) present.  Cardiovascular: Normal rate and regular rhythm. Exam reveals no gallop and no friction rub.  No murmur heard. Pulmonary/Chest: Effort normal. She has no wheezes. She has no rales.  Abdominal: Soft. She exhibits no distension. There is no tenderness.  Musculoskeletal: She exhibits edema (3+ to the thighs). She exhibits no tenderness.  Neurological: She is alert and oriented to person, place, and time.  Skin: Skin is warm and dry. She is not diaphoretic.  Psychiatric: She has a normal  mood and affect. Her behavior is normal.  Nursing note and vitals reviewed.    ED Treatments / Results  Labs (all labs ordered are listed, but only abnormal results are displayed) Labs Reviewed  COMPREHENSIVE METABOLIC PANEL - Abnormal; Notable for the following components:      Result Value   Sodium 146 (*)    Potassium 3.4 (*)    Chloride 113 (*)    Glucose, Bld 197 (*)    BUN 35 (*)    Creatinine, Ser 2.09 (*)    Total Protein 5.6 (*)    Albumin 3.0 (*)    GFR calc non Af Amer 25 (*)    GFR calc Af Amer 29 (*)    All other components within normal limits  CBC WITH DIFFERENTIAL/PLATELET - Abnormal; Notable for the following components:   RBC 3.60 (*)    Hemoglobin 10.5 (*)    HCT 33.6 (*)    RDW 15.9 (*)    Platelets 97 (*)    All other components within normal limits  URINALYSIS, ROUTINE W REFLEX MICROSCOPIC - Abnormal; Notable for the following components:   APPearance HAZY (*)    Glucose, UA 150 (*)    Hgb urine dipstick MODERATE (*)    Protein, ur >=300 (*)    Leukocytes, UA TRACE (*)    WBC, UA >50 (*)    Bacteria, UA MANY (*)    All other components within normal limits  BRAIN NATRIURETIC PEPTIDE - Abnormal; Notable for the following components:   B  Natriuretic Peptide 1,439.6 (*)    All other components within normal limits  I-STAT CHEM 8, ED - Abnormal; Notable for the following components:   Sodium 147 (*)    BUN 35 (*)    Creatinine, Ser 2.20 (*)    Glucose, Bld 198 (*)    Hemoglobin 10.5 (*)    HCT 31.0 (*)    All other components within normal limits  TSH  T4  I-STAT CG4 LACTIC ACID, ED  I-STAT CG4 LACTIC ACID, ED  I-STAT TROPONIN, ED    EKG EKG Interpretation  Date/Time:  Wednesday December 21 2017 18:24:47 EDT Ventricular Rate:  85 PR Interval:  140 QRS Duration: 78 QT Interval:  406 QTC Calculation: 483 R Axis:   66 Text Interpretation:  Normal sinus rhythm Septal infarct , age undetermined Abnormal ECG No significant change since last tracing Confirmed by Melene Plan [phone]) on 12/21/2017 7:46:57 PM   Radiology Dg Chest 2 View  Result Date: 12/21/2017 CLINICAL DATA:  Dyspnea EXAM: CHEST - 2 VIEW COMPARISON:  11/05/2017 chest radiograph. FINDINGS: Stable cardiomediastinal silhouette with mild cardiomegaly. No pneumothorax. Small bilateral pleural effusions. Mild-to-moderate pulmonary edema. IMPRESSION: Mild-to-moderate congestive heart failure with small bilateral pleural effusions. Electronically Signed   By: Delbert Phenix M.D.   On: 12/21/2017 18:19    Procedures Procedures (including critical care time)  Medications Ordered in ED Medications  hydrALAZINE (APRESOLINE) tablet 25 mg (25 mg Oral Given 12/21/17 1846)  carvedilol (COREG) tablet 25 mg (25 mg Oral Given 12/21/17 1849)  losartan (COZAAR) tablet 25 mg (25 mg Oral Given 12/21/17 1846)  potassium chloride SA (K-DUR,KLOR-CON) CR tablet 40 mEq (40 mEq Oral Given 12/21/17 1846)  furosemide (LASIX) injection 40 mg (40 mg Intravenous Given 12/21/17 1847)  magnesium oxide (MAG-OX) tablet 800 mg (800 mg Oral Given 12/21/17 1846)     Initial Impression / Assessment and Plan / ED Course  I have reviewed the triage vital signs and  the nursing notes.  Pertinent  labs & imaging results that were available during my care of the patient were reviewed by me and considered in my medical decision making (see chart for details).     57 yo F with a cc of leg swelling and sob.  Concerning for CHF exacerbation based on hx and physical.    Will check labs chest x-ray give the patient a bolus of Lasix if her potassium is okay.  Patient missed her morning dose of her medications will give her morning blood pressure medications.  The patient's blood pressure is improved she also has had some improvement of her leg edema with good urine output.  She is able to ambulate without difficulty does not become hypoxic.  Discharge patient home.  Have her follow-up with her PCP.  Patient states that she is moving to the area.  She has an active cardiologist in [place].  She will call them on the phone tomorrow to discuss her increasing her Lasix for the next week.  Her potassium was 3.5 and she was given a dose of potassium here.  Her urine appeared contaminated.  We will not treat at this time.  She is given follow-up with cardiology here.    3:07 PM:  I have discussed the diagnosis/risks/treatment options with the patient and believe the pt to be eligible for discharge home to follow-up with PCP, Cards. We also discussed returning to the ED immediately if new or worsening sx occur. We discussed the sx which are most concerning (e.g., sudden worsening pain, fever, inability to tolerate by mouth) that necessitate immediate return. Medications administered to the patient during their visit and any new prescriptions provided to the patient are listed below.  Medications given during this visit Medications  hydrALAZINE (APRESOLINE) tablet 25 mg (25 mg Oral Given 12/21/17 1846)  carvedilol (COREG) tablet 25 mg (25 mg Oral Given 12/21/17 1849)  losartan (COZAAR) tablet 25 mg (25 mg Oral Given 12/21/17 1846)  potassium chloride SA (K-DUR,KLOR-CON) CR tablet 40 mEq (40 mEq Oral  Given 12/21/17 1846)  furosemide (LASIX) injection 40 mg (40 mg Intravenous Given 12/21/17 1847)  magnesium oxide (MAG-OX) tablet 800 mg (800 mg Oral Given 12/21/17 1846)     The patient appears reasonably screen and/or stabilized for discharge and I doubt any other medical condition or other [facility] requiring further screening, evaluation, or treatment in the ED at this time prior to discharge.   Final Clinical Impressions(s) / ED Diagnoses   Final diagnoses:  Acute on chronic systolic congestive heart failure [facility])    ED Discharge Orders    None       Melene Plan, DO 12/22/17 1507

## 2017-12-21 NOTE — ED Triage Notes (Signed)
Pt in c/o bilateral lower leg swelling and blistering for the last few days, history of same, unsure what triggered it this time, pt concerned because she is a diabetic, worse to left lower leg

## 2017-12-22 LAB — T4: T4 TOTAL: 7.5 ug/dL (ref 4.5–12.0)

## 2018-01-06 ENCOUNTER — Emergency Department (HOSPITAL_COMMUNITY)
Admission: EM | Admit: 2018-01-06 | Discharge: 2018-01-06 | Disposition: A | Discharge status: Home / Self Care | Payer: Self-pay | Attending: Emergency Medicine | Admitting: Emergency Medicine

## 2018-01-06 ENCOUNTER — Encounter (HOSPITAL_COMMUNITY): Payer: Self-pay | Admitting: Emergency Medicine

## 2018-01-06 DIAGNOSIS — I10 Essential (primary) hypertension: Secondary | ICD-10-CM

## 2018-01-06 DIAGNOSIS — N183 Chronic kidney disease, stage 3 (moderate): Secondary | ICD-10-CM | POA: Insufficient documentation

## 2018-01-06 DIAGNOSIS — Z794 Long term (current) use of insulin: Secondary | ICD-10-CM | POA: Insufficient documentation

## 2018-01-06 DIAGNOSIS — Z7982 Long term (current) use of aspirin: Secondary | ICD-10-CM | POA: Insufficient documentation

## 2018-01-06 DIAGNOSIS — Y33XXXA Other specified events, undetermined intent, initial encounter: Secondary | ICD-10-CM | POA: Insufficient documentation

## 2018-01-06 DIAGNOSIS — S81802A Unspecified open wound, left lower leg, initial encounter: Secondary | ICD-10-CM | POA: Insufficient documentation

## 2018-01-06 DIAGNOSIS — I130 Hypertensive heart and chronic kidney disease with heart failure and stage 1 through stage 4 chronic kidney disease, or unspecified chronic kidney disease: Secondary | ICD-10-CM | POA: Insufficient documentation

## 2018-01-06 DIAGNOSIS — E1122 Type 2 diabetes mellitus with diabetic chronic kidney disease: Secondary | ICD-10-CM | POA: Insufficient documentation

## 2018-01-06 DIAGNOSIS — Y929 Unspecified place or not applicable: Secondary | ICD-10-CM | POA: Insufficient documentation

## 2018-01-06 DIAGNOSIS — Z5189 Encounter for other specified aftercare: Secondary | ICD-10-CM | POA: Insufficient documentation

## 2018-01-06 DIAGNOSIS — Z79899 Other long term (current) drug therapy: Secondary | ICD-10-CM | POA: Insufficient documentation

## 2018-01-06 DIAGNOSIS — Y939 Activity, unspecified: Secondary | ICD-10-CM | POA: Insufficient documentation

## 2018-01-06 DIAGNOSIS — Y999 Unspecified external cause status: Secondary | ICD-10-CM | POA: Insufficient documentation

## 2018-01-06 DIAGNOSIS — I509 Heart failure, unspecified: Secondary | ICD-10-CM | POA: Insufficient documentation

## 2018-01-06 LAB — CBC WITH DIFFERENTIAL/PLATELET
Abs Immature Granulocytes: 0.1 10*3/uL — ABNORMAL HIGH (ref 0.00–0.07)
BASOS PCT: 0 %
Basophils Absolute: 0 10*3/uL (ref 0.0–0.1)
EOS ABS: 0.2 10*3/uL (ref 0.0–0.5)
EOS PCT: 3 %
HEMATOCRIT: 30.3 % — AB (ref 36.0–46.0)
Hemoglobin: 9.3 g/dL — ABNORMAL LOW (ref 12.0–15.0)
IMMATURE GRANULOCYTES: 2 %
LYMPHS ABS: 0.9 10*3/uL (ref 0.7–4.0)
Lymphocytes Relative: 13 %
MCH: 29.1 pg (ref 26.0–34.0)
MCHC: 30.7 g/dL (ref 30.0–36.0)
MCV: 94.7 fL (ref 80.0–100.0)
MONOS PCT: 7 %
Monocytes Absolute: 0.5 10*3/uL (ref 0.1–1.0)
Neutro Abs: 5.1 10*3/uL (ref 1.7–7.7)
Neutrophils Relative %: 75 %
PLATELETS: 119 10*3/uL — AB (ref 150–400)
RBC: 3.2 MIL/uL — ABNORMAL LOW (ref 3.87–5.11)
RDW: 15.2 % (ref 11.5–15.5)
WBC: 6.9 10*3/uL (ref 4.0–10.5)
nRBC: 0 % (ref 0.0–0.2)

## 2018-01-06 LAB — BASIC METABOLIC PANEL
ANION GAP: 8 (ref 5–15)
BUN: 50 mg/dL — AB (ref 6–20)
CALCIUM: 8.8 mg/dL — AB (ref 8.9–10.3)
CO2: 26 mmol/L (ref 22–32)
CREATININE: 2.88 mg/dL — AB (ref 0.44–1.00)
Chloride: 111 mmol/L (ref 98–111)
GFR calc Af Amer: 20 mL/min — ABNORMAL LOW (ref >60)
GFR, EST NON AFRICAN AMERICAN: 17 mL/min — AB (ref >60)
GLUCOSE: 237 mg/dL — AB (ref 70–99)
Potassium: 3.8 mmol/L (ref 3.5–5.1)
Sodium: 145 mmol/L (ref 135–145)

## 2018-01-06 MED ORDER — METOPROLOL TARTRATE 5 MG/5ML IV SOLN
5.0000 mg | Freq: Once | INTRAVENOUS | Status: AC
  Administered 2018-01-06: 5 mg via INTRAVENOUS
  Filled 2018-01-06: qty 5

## 2018-01-06 MED ORDER — CARVEDILOL 25 MG PO TABS
50.0000 mg | ORAL_TABLET | Freq: Two times a day (BID) | ORAL | Status: DC
  Administered 2018-01-06: 50 mg via ORAL
  Filled 2018-01-06 (×2): qty 2

## 2018-01-06 MED ORDER — BACITRACIN ZINC 500 UNIT/GM EX OINT
TOPICAL_OINTMENT | CUTANEOUS | Status: DC | PRN
  Administered 2018-01-06: 3 via TOPICAL
  Filled 2018-01-06: qty 2.7

## 2018-01-06 NOTE — Discharge Instructions (Addendum)
Take your medications as directed.  Follow-up in the community wellness center until you can find a regular doctor

## 2018-01-06 NOTE — ED Notes (Signed)
This RN dressed pt's L leg wound with bacitracin ointment, non-adhesive pads, then wrapped it with gauze.

## 2018-01-06 NOTE — ED Triage Notes (Signed)
Pt c/o left lower leg infection that has been going on for 3 weeks. Report started out as a blister but now spread and skin coming off. Reports painful and concerned since she is a diabetic and denies being on antibiotics for it.

## 2018-01-06 NOTE — ED Notes (Signed)
Sticker Machine not working

## 2018-01-06 NOTE — ED Notes (Signed)
Pt ambulating to bathroom.

## 2018-01-06 NOTE — ED Provider Notes (Addendum)
[facility]-EMERGENCY DEPT Provider Note   CSN: [id number] Arrival date & time: 01/06/18  0941     History   Chief Complaint Chief Complaint  Patient presents with  . leg infection    HPI Hebe Merriwether is a 57 y.o. female.  57 year old female presents with 3-week history of wound to her left lower extremity.  Patient previously had a blister there which ruptured and since that time has had an excoriated area without surrounding erythema or fever.  Seen recently for leg swelling and treated with diuretics.  Denies any pleuritic chest pain.  No vomiting noted.  No distal numbness or tingling.  Is here today because of concern for infection.     Past Medical History:  Diagnosis Date  . CHF (congestive heart failure) (HCC)   . Diabetes mellitus without complication (HCC)   . Hypertension     Patient Active Problem List   Diagnosis Date Noted  . Hyperkalemia 11/03/2017  . Thrombocytopenia (HCC) 11/09/2016  . Acute respiratory failure with hypoxia (HCC) 11/09/2016  . Acute systolic CHF (congestive heart failure) (HCC) 11/09/2016  . Anemia 11/09/2016  . Hypertension 11/09/2016  . 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 AKI (acute kidney injury) (HCC) 11/09/2016  . CKD (chronic kidney disease), stage III (HCC) 11/09/2016  . Acute on chronic diastolic CHF (congestive heart failure) (HCC) 11/09/2016    Past Surgical History:  Procedure Laterality Date  . CHOLECYSTECTOMY       OB History   None      Home Medications    Prior to Admission medications   Medication Sig Start Date End Date Taking? Authorizing Provider  albuterol (PROVENTIL HFA;VENTOLIN HFA) 108 (90 Base) MCG/ACT inhaler Inhale 2 puffs into the lungs every 4 (four) hours as needed for wheezing or shortness of breath. 11/13/16   Alison Murray, MD  allopurinol (ZYLOPRIM) 100 MG tablet Take 1 tablet (100 mg total) by mouth daily. Patient taking  differently: Take 100 mg by mouth 2 (two) times daily.  11/13/16   Alison Murray, MD  aspirin 81 MG chewable tablet Chew 81 mg by mouth daily.    [provider]  atorvastatin (LIPITOR) 40 MG tablet Take 1 tablet (40 mg total) by mouth daily. 11/13/16   Alison Murray, MD  calcitRIOL (ROCALTROL) 0.25 MCG capsule Take 0.25 mcg by mouth daily. 09/12/17   [provider]  carvedilol (COREG) 25 MG tablet Take 2 tablets (50 mg total) by mouth 2 (two) times daily with a meal. 11/05/17   Albertine Grates, MD  cholecalciferol (VITAMIN D) 1000 units tablet Take 1,000 Units by mouth 2 (two) times daily.     [provider]  dicyclomine (BENTYL) 20 MG tablet Take 20 mg by mouth as needed. 07/25/17   [provider]  ferrous sulfate 325 (65 FE) MG EC tablet Take 1 tablet (325 mg total) by mouth 2 (two) times daily. 11/13/16   Alison Murray, MD  furosemide (LASIX) 40 MG tablet Take 1 tablet (40 mg total) by mouth daily. 11/05/17   Albertine Grates, MD  hydrALAZINE (APRESOLINE) 25 MG tablet Take 1 tablet (25 mg total) by mouth 3 (three) times daily. 11/05/17 12/05/17  Albertine Grates, MD  Insulin Glargine [facility] KWIKPEN) 100 UNIT/ML SOPN Inject 25 Units into the skin at bedtime.    [provider]  isosorbide mononitrate (IMDUR) 30 MG 24 hr tablet Take 1 tablet (30 mg total) by mouth daily.  11/05/17 11/05/18  Albertine Grates, MD  liraglutide (VICTOZA) 18 MG/3ML SOPN Inject 1.8 mg into the skin every evening.     [provider]  loperamide (IMODIUM) 2 MG capsule Take 1 capsule (2 mg total) by mouth 4 (four) times daily as needed for diarrhea or loose stools. 11/29/16   Audry Pili, PA-C  losartan (COZAAR) 25 MG tablet Take 1 tablet (25 mg total) by mouth daily. 11/06/17   Albertine Grates, MD  metolazone (ZAROXOLYN) 2.5 MG tablet Take 2.5 mg by mouth daily. 10/11/17   [provider]  pantoprazole (PROTONIX) 40 MG tablet Take 40 mg by mouth daily. 10/03/17   [provider]  potassium chloride  SA (K-DUR,KLOR-CON) 20 MEQ tablet Take 1 tablet (20 mEq total) by mouth every Monday, Wednesday, and Friday. 11/07/17   Albertine Grates, MD  sodium bicarbonate 650 MG tablet Take 650 mg by mouth daily. 10/03/17   [provider]    Family History Family History  Problem Relation Age of Onset  . Diabetes Mother   . Hypertension Mother   . Breast cancer Mother   . Diabetes Father   . Hypertension Father   . Pancreatic cancer Father   . Stroke Neg Hx     Social History Social History   Tobacco Use  . Smoking status: Never Smoker  . Smokeless tobacco: Never Used  Substance Use Topics  . Alcohol use: No  . Drug use: No     Allergies   Patient has no known allergies.   Review of Systems Review of Systems  All other systems reviewed and are negative.    Physical Exam Updated Vital Signs BP (!) 200/96 (BP Location: Right Arm)   Pulse (!) 114   Temp 98.5 F (36.9 C) (Oral)   Resp 20   SpO2 94%   Physical Exam  Constitutional: She is oriented to person, place, and time. She appears well-developed and well-nourished.  Non-toxic appearance. No distress.  HENT:  Head: Normocephalic and atraumatic.  Eyes: Pupils are equal, round, and reactive to light. Conjunctivae, EOM and lids are normal.  Neck: Normal range of motion. Neck supple. No tracheal deviation present. No thyroid mass present.  Cardiovascular: Normal rate, regular rhythm and normal heart sounds. Exam reveals no gallop.  No murmur heard. Pulmonary/Chest: Effort normal and breath sounds normal. No stridor. No respiratory distress. She has no decreased breath sounds. She has no wheezes. She has no rhonchi. She has no rales.  Abdominal: Soft. Normal appearance and bowel sounds are normal. She exhibits no distension. There is no tenderness. There is no rebound and no CVA tenderness.  Musculoskeletal: Normal range of motion. She exhibits no edema or tenderness.       Legs: Neurological: She is alert and oriented to  person, place, and time. She has normal strength. No cranial nerve deficit or sensory deficit. GCS eye subscore is 4. GCS verbal subscore is 5. GCS motor subscore is 6.  Skin: Skin is warm and dry. No abrasion and no rash noted.  Psychiatric: She has a normal mood and affect. Her speech is normal and behavior is normal.  Nursing note and vitals reviewed.    ED Treatments / Results  Labs (all labs ordered are listed, but only abnormal results are displayed) Labs Reviewed - No data to display  EKG None  Radiology No results found.  Procedures Procedures (including critical care time)  Medications Ordered in ED Medications  bacitracin ointment (has no administration in time range)  Initial Impression / Assessment and Plan / ED Course  I have reviewed the triage vital signs and the nursing notes.  Pertinent labs & imaging results that were available during my care of the patient were reviewed by me and considered in my medical decision making (see chart for details).     Patient's blood pressure and heart rate noted.  Patient has not taken her morning medications and has been encouraged to do so.  Patient has no signs of cellulitis at this time.  Will give patient referral to the wound care clinic  2:11 PM Patient was initially to be discharged but then blood pressure and heart rate noted.  Patient given additional dose of beta-blocker here.  Improvement of symptoms.  Does have some worsening of her renal insufficiency.  She is currently asymptomatic.  Will adjust patient's antihypertension medication and give patient referral to the community wellness center as well as the wound care clinic  CRITICAL CARE Performed by: Toy Baker Total critical care time: 45 minutes Critical care time was exclusive of separately billable procedures and treating other patients. Critical care was necessary to treat or prevent imminent or life-threatening deterioration. Critical care was  time spent personally by me on the following activities: development of treatment plan with patient and/or surrogate as well as nursing, discussions with consultants, evaluation of patient's response to treatment, examination of patient, obtaining history from patient or surrogate, ordering and performing treatments and interventions, ordering and review of laboratory studies, ordering and review of radiographic studies, pulse oximetry and re-evaluation of patient's condition.   Final Clinical Impressions(s) / ED Diagnoses   Final diagnoses:  None    ED Discharge Orders    None       Lorre Nick, MD 01/06/18 1023    Lorre Nick, MD 01/06/18 [phone]

## 2018-01-12 ENCOUNTER — Other Ambulatory Visit: Payer: Self-pay

## 2018-01-12 ENCOUNTER — Encounter: Payer: Self-pay | Admitting: Family Medicine

## 2018-01-12 ENCOUNTER — Emergency Department (HOSPITAL_COMMUNITY)
Admission: EM | Admit: 2018-01-12 | Discharge: 2018-01-12 | Disposition: A | Discharge status: Home / Self Care | Payer: Self-pay | Attending: Emergency Medicine | Admitting: Emergency Medicine

## 2018-01-12 ENCOUNTER — Encounter (HOSPITAL_COMMUNITY): Payer: Self-pay

## 2018-01-12 ENCOUNTER — Ambulatory Visit (INDEPENDENT_AMBULATORY_CARE_PROVIDER_SITE_OTHER): Payer: Self-pay | Admitting: Family Medicine

## 2018-01-12 VITALS — BP 160/95 | HR 95 | Temp 97.9°F | Resp 19 | Ht 64.0 in | Wt 213.6 lb

## 2018-01-12 DIAGNOSIS — I130 Hypertensive heart and chronic kidney disease with heart failure and stage 1 through stage 4 chronic kidney disease, or unspecified chronic kidney disease: Secondary | ICD-10-CM | POA: Insufficient documentation

## 2018-01-12 DIAGNOSIS — L97929 Non-pressure chronic ulcer of unspecified part of left lower leg with unspecified severity: Secondary | ICD-10-CM

## 2018-01-12 DIAGNOSIS — E1122 Type 2 diabetes mellitus with diabetic chronic kidney disease: Secondary | ICD-10-CM

## 2018-01-12 DIAGNOSIS — N183 Chronic kidney disease, stage 3 (moderate): Secondary | ICD-10-CM

## 2018-01-12 DIAGNOSIS — R739 Hyperglycemia, unspecified: Secondary | ICD-10-CM

## 2018-01-12 DIAGNOSIS — I509 Heart failure, unspecified: Secondary | ICD-10-CM

## 2018-01-12 DIAGNOSIS — N184 Chronic kidney disease, stage 4 (severe): Secondary | ICD-10-CM

## 2018-01-12 DIAGNOSIS — E11622 Type 2 diabetes mellitus with other skin ulcer: Secondary | ICD-10-CM

## 2018-01-12 DIAGNOSIS — Z794 Long term (current) use of insulin: Secondary | ICD-10-CM | POA: Insufficient documentation

## 2018-01-12 DIAGNOSIS — I10 Essential (primary) hypertension: Secondary | ICD-10-CM

## 2018-01-12 DIAGNOSIS — Z7689 Persons encountering health services in other specified circumstances: Secondary | ICD-10-CM

## 2018-01-12 DIAGNOSIS — Z79899 Other long term (current) drug therapy: Secondary | ICD-10-CM | POA: Insufficient documentation

## 2018-01-12 LAB — URINALYSIS, ROUTINE W REFLEX MICROSCOPIC
BILIRUBIN URINE: NEGATIVE
GLUCOSE, UA: 150 mg/dL — AB
Hgb urine dipstick: NEGATIVE
Ketones, ur: NEGATIVE mg/dL
Leukocytes, UA: NEGATIVE
NITRITE: NEGATIVE
PH: 6 (ref 5.0–8.0)
Protein, ur: >=300 mg/dL — AB
SPECIFIC GRAVITY, URINE: 1.017 (ref 1.005–1.030)

## 2018-01-12 LAB — COMPREHENSIVE METABOLIC PANEL
ALBUMIN: 3.2 g/dL — AB (ref 3.5–5.0)
ALT: 15 U/L (ref 0–44)
ANION GAP: 7 (ref 5–15)
AST: 15 U/L (ref 15–41)
Alkaline Phosphatase: 74 U/L (ref 38–126)
BUN: 42 mg/dL — ABNORMAL HIGH (ref 6–20)
CALCIUM: 8.7 mg/dL — AB (ref 8.9–10.3)
CO2: 26 mmol/L (ref 22–32)
Chloride: 114 mmol/L — ABNORMAL HIGH (ref 98–111)
Creatinine, Ser: 2.68 mg/dL — ABNORMAL HIGH (ref 0.44–1.00)
GFR calc Af Amer: 21 mL/min — ABNORMAL LOW (ref >60)
GFR, EST NON AFRICAN AMERICAN: 18 mL/min — AB (ref >60)
GLUCOSE: 190 mg/dL — AB (ref 70–99)
Potassium: 3.5 mmol/L (ref 3.5–5.1)
Sodium: 147 mmol/L — ABNORMAL HIGH (ref 135–145)
Total Bilirubin: 1.2 mg/dL (ref 0.3–1.2)
Total Protein: 6 g/dL — ABNORMAL LOW (ref 6.5–8.1)

## 2018-01-12 LAB — CBC WITH DIFFERENTIAL/PLATELET
ABS IMMATURE GRANULOCYTES: 0.07 10*3/uL (ref 0.00–0.07)
BASOS ABS: 0.1 10*3/uL (ref 0.0–0.1)
BASOS PCT: 1 %
EOS ABS: 0.3 10*3/uL (ref 0.0–0.5)
Eosinophils Relative: 4 %
HCT: 30.8 % — ABNORMAL LOW (ref 36.0–46.0)
Hemoglobin: 9.4 g/dL — ABNORMAL LOW (ref 12.0–15.0)
IMMATURE GRANULOCYTES: 1 %
Lymphocytes Relative: 18 %
Lymphs Abs: 1.2 10*3/uL (ref 0.7–4.0)
MCH: 28.9 pg (ref 26.0–34.0)
MCHC: 30.5 g/dL (ref 30.0–36.0)
MCV: 94.8 fL (ref 80.0–100.0)
MONO ABS: 0.5 10*3/uL (ref 0.1–1.0)
MONOS PCT: 7 %
NEUTROS PCT: 69 %
Neutro Abs: 4.6 10*3/uL (ref 1.7–7.7)
PLATELETS: 141 10*3/uL — AB (ref 150–400)
RBC: 3.25 MIL/uL — ABNORMAL LOW (ref 3.87–5.11)
RDW: 15.8 % — ABNORMAL HIGH (ref 11.5–15.5)
WBC: 6.7 10*3/uL (ref 4.0–10.5)
nRBC: 0 % (ref 0.0–0.2)

## 2018-01-12 MED ORDER — CLONIDINE HCL 0.1 MG PO TABS
0.1000 mg | ORAL_TABLET | Freq: Once | ORAL | Status: AC
  Administered 2018-01-12: 0.1 mg via ORAL

## 2018-01-12 MED ORDER — CARVEDILOL 25 MG PO TABS: 50.0000 mg | ORAL_TABLET | Freq: Two times a day (BID) | ORAL | 0 refills | Status: DC

## 2018-01-12 MED ORDER — LIRAGLUTIDE 18 MG/3ML ~~LOC~~ SOPN: 1.8000 mg | PEN_INJECTOR | Freq: Every evening | SUBCUTANEOUS | 1 refills | Status: AC

## 2018-01-12 MED ORDER — ATORVASTATIN CALCIUM 40 MG PO TABS: 40.0000 mg | ORAL_TABLET | Freq: Every day | ORAL | 2 refills | Status: AC

## 2018-01-12 MED ORDER — PANTOPRAZOLE SODIUM 40 MG PO TBEC: 40.0000 mg | DELAYED_RELEASE_TABLET | Freq: Every day | ORAL | 3 refills | Status: AC

## 2018-01-12 MED ORDER — POTASSIUM CHLORIDE CRYS ER 20 MEQ PO TBCR: 20.0000 meq | EXTENDED_RELEASE_TABLET | ORAL | 0 refills | Status: DC

## 2018-01-12 MED ORDER — ISOSORBIDE MONONITRATE ER 30 MG PO TB24: 30.0000 mg | ORAL_TABLET | Freq: Every day | ORAL | 2 refills | Status: AC

## 2018-01-12 MED ORDER — HYDRALAZINE HCL 50 MG PO TABS: 50.0000 mg | ORAL_TABLET | Freq: Three times a day (TID) | ORAL | 1 refills | Status: AC

## 2018-01-12 MED ORDER — LABETALOL HCL 5 MG/ML IV SOLN
20.0000 mg | Freq: Once | INTRAVENOUS | Status: AC
  Administered 2018-01-12: 20 mg via INTRAVENOUS
  Filled 2018-01-12: qty 4

## 2018-01-12 MED ORDER — FUROSEMIDE 40 MG PO TABS: 40.0000 mg | ORAL_TABLET | Freq: Every day | ORAL | 1 refills | Status: DC

## 2018-01-12 MED ORDER — HYDRALAZINE HCL 20 MG/ML IJ SOLN
20.0000 mg | Freq: Once | INTRAMUSCULAR | Status: AC
  Administered 2018-01-12: 20 mg via INTRAVENOUS
  Filled 2018-01-12: qty 1

## 2018-01-12 MED ORDER — AMLODIPINE BESYLATE 10 MG PO TABS: 10.0000 mg | ORAL_TABLET | Freq: Every day | ORAL | 2 refills | Status: AC

## 2018-01-12 MED FILL — ATORVASTATIN 40 MG TABLET: 40 | 30 days supply | Qty: 30 | Fill #0

## 2018-01-12 MED FILL — POTASSIUM CL ER 20 MEQ TABL: 20 | 28 days supply | Qty: 12 | Fill #0

## 2018-01-12 MED FILL — hydrALAZINE HCL 50 MG TABS: 50 | 30 days supply | Qty: 90 | Fill #0

## 2018-01-12 MED FILL — !VICTOZA 18MG/3ML INJECT: 18 | 28 days supply | Qty: 3 | Fill #0

## 2018-01-12 MED FILL — CARVEDILOL 25 MG TABLET: 25 | 30 days supply | Qty: 120 | Fill #0

## 2018-01-12 MED FILL — PANTOPRAZOLE SOD DR 40 MG T: 40 | 30 days supply | Qty: 30 | Fill #0

## 2018-01-12 MED FILL — ISOSORBIDE MN ER 30 MG TAB: 30 | 30 days supply | Qty: 30 | Fill #0

## 2018-01-12 MED FILL — FUROSEMIDE 40 MG TAB: 40 | 30 days supply | Qty: 30 | Fill #0

## 2018-01-12 MED FILL — AMLODIPINE BESYLATE 10 MG T: 10 | 30 days supply | Qty: 30 | Fill #0

## 2018-01-12 NOTE — ED Triage Notes (Signed)
Per EMS: Pt sent from PCP for hypertension of SBP 180.  NP came 0.4 mg PO clonidine.  Pt asymptomatic.  Pt went to pcp for high blood pressure.  Pt seen at [facility] last week for L leg infection.

## 2018-01-12 NOTE — Patient Instructions (Addendum)
  Go immediately to [facility] for further evaluation of your blood pressure, low oxygen, and elevated heart rate.  Thank you for choosing [facility] at [facility] to be your medical home!    Annia Belt was seen by Joaquin Courts, FNP today.   Ellard Artis primary care provider is Bing Neighbors, FNP.   For the best care possible, you should try to see Joaquin Courts, FNP-C whenever you come to the clinic.   We look forward to seeing you again soon!  If you have any questions about your visit today, please call us at [phone] or feel free to reach your Bing Neighbors, FNP via MyChart.

## 2018-01-12 NOTE — Progress Notes (Signed)
Barbara Velazquez, is a 57 y.o. female  WUJ:[id number]  NFA:[id number]  DOB - 02-07-1961  CC:  Chief Complaint  Patient presents with  . Establish Care  . Follow-up    ED 10/25: wound on L lower extremity. states that the wound is healing fine  . Diabetes    doesn't check FSBS at home b/c she can't see the little writing on the meter. no nausea, vomiting, numbness/tingling in her feet, polyuria or polydipsia  . Hypertension    denies chest pain, SHOB, lower extremity swelling       HPI: Barbara Velazquez is a 57 y.o. female is here today to establish care.   Barbara Velazquez has Thrombocytopenia (HCC); Anemia; Hypertension; DM (diabetes mellitus), type 2 with renal complications (HCC); CKD (chronic kidney disease), stage III (HCC); and Hyperkalemia on their problem list.    Today's visit:  Barbara Velazquez presents today with multiple complaints. On arrival, she is very hypertensive. Reports that she took medication this morning but reports her blood pressure has remained elevated up to 200 systolic when has checked it at home. She suffers with Type 2 Diabetes and has been without medication for over 1 month and uncertain of blood sugars. Most recent A1C 6.3 was obtained in August. Recently relocated from [place] to live with son here in [place] and presently is without health insurance (previoulsy receiving medicaid). She was recently seen at the ER for left lower leg wound. She is uncertain of an injury occurring. She has had a similar wound on the right leg previously that healed. She has since been referred to the wound care center with an appointment scheduled for next week. She suffers from CHF and has not established with a cardiologist here as she didn't have a PCP. She complains of chronic SOB which is worsened with ambulation. She suffer from CKD and uncertain of her last nephrology evaluation.   Patient denies new headaches, chest pain, abdominal pain, nausea, new weakness , numbness or  tingling, SOB, edema, or worrisome cough.    Current medications: Current Outpatient Medications:  .  albuterol (PROVENTIL HFA;VENTOLIN HFA) 108 (90 Base) MCG/ACT inhaler, Inhale 2 puffs into the lungs every 4 (four) hours as needed for wheezing or shortness of breath., Disp: 1 Inhaler, Rfl: 0 .  allopurinol (ZYLOPRIM) 100 MG tablet, Take 1 tablet (100 mg total) by mouth daily. (Patient taking differently: Take 100 mg by mouth 2 (two) times daily. ), Disp: 30 tablet, Rfl: 0 .  amLODipine (NORVASC) 10 MG tablet, Take 10 mg by mouth daily., Disp: , Rfl: 1 .  aspirin 81 MG chewable tablet, Chew 81 mg by mouth daily., Disp: , Rfl:  .  atorvastatin (LIPITOR) 40 MG tablet, Take 1 tablet (40 mg total) by mouth daily., Disp: 30 tablet, Rfl: 0 .  calcitRIOL (ROCALTROL) 0.25 MCG capsule, Take 0.25 mcg by mouth daily., Disp: , Rfl: 0 .  carvedilol (COREG) 25 MG tablet, Take 2 tablets (50 mg total) by mouth 2 (two) times daily with a meal., Disp: 60 tablet, Rfl: 0 .  cholecalciferol (VITAMIN D) 1000 units tablet, Take 1,000 Units by mouth 2 (two) times daily. , Disp: , Rfl:  .  dicyclomine (BENTYL) 20 MG tablet, Take 20 mg by mouth as needed., Disp: , Rfl:  .  furosemide (LASIX) 40 MG tablet, Take 1 tablet (40 mg total) by mouth daily., Disp: 30 tablet, Rfl: 0 .  hydrALAZINE (APRESOLINE) 25 MG tablet, Take 25 mg by mouth 3 (three) times daily., Disp: ,  Rfl: 0 .  Insulin Glargine (BASAGLAR KWIKPEN) 100 UNIT/ML SOPN, Inject 25 Units into the skin at bedtime., Disp: , Rfl:  .  isosorbide mononitrate (IMDUR) 30 MG 24 hr tablet, Take 1 tablet (30 mg total) by mouth daily., Disp: 30 tablet, Rfl: 0 .  liraglutide (VICTOZA) 18 MG/3ML SOPN, Inject 1.8 mg into the skin every evening. , Disp: , Rfl:  .  loperamide (IMODIUM) 2 MG capsule, Take 1 capsule (2 mg total) by mouth 4 (four) times daily as needed for diarrhea or loose stools., Disp: 12 capsule, Rfl: 0 .  losartan (COZAAR) 25 MG tablet, Take 1 tablet (25 mg  total) by mouth daily., Disp: 30 tablet, Rfl: 0 .  metolazone (ZAROXOLYN) 2.5 MG tablet, Take 2.5 mg by mouth daily., Disp: , Rfl: 1 .  pantoprazole (PROTONIX) 40 MG tablet, Take 40 mg by mouth daily., Disp: , Rfl: 1 .  potassium chloride SA (K-DUR,KLOR-CON) 20 MEQ tablet, Take 1 tablet (20 mEq total) by mouth every Monday, Wednesday, and Friday., Disp: 30 tablet, Rfl: 0 .  sodium bicarbonate 650 MG tablet, Take 650 mg by mouth daily., Disp: , Rfl: 0   Pertinent family medical history: family history includes Breast cancer in her mother; Diabetes in her father and mother; Hypertension in her father and mother; Pancreatic cancer in her father.   No Known Allergies  Social History   Socioeconomic History  . Marital status: Divorced    Spouse name: Not on file  . Number of children: Not on file  . Years of education: Not on file  . Highest education level: Not on file  Occupational History  . Not on file  Social Needs  . Financial resource strain: Not on file  . Food insecurity:    Worry: Not on file    Inability: Not on file  . Transportation needs:    Medical: Not on file    Non-medical: Not on file  Tobacco Use  . Smoking status: Never Smoker  . Smokeless tobacco: Never Used  Substance and Sexual Activity  . Alcohol use: No  . Drug use: No  . Sexual activity: Not on file  Lifestyle  . Physical activity:    Days per week: Not on file    Minutes per session: Not on file  . Stress: Not on file  Relationships  . Social connections:    Talks on phone: Not on file    Gets together: Not on file    Attends religious service: Not on file    Active member of club or organization: Not on file    Attends meetings of clubs or organizations: Not on file    Relationship status: Not on file  . Intimate partner violence:    Fear of current or ex partner: Not on file    Emotionally abused: Not on file    Physically abused: Not on file    Forced sexual activity: Not on file  Other  Topics Concern  . Not on file  Social History Narrative  . Not on file    Review of Systems: Constitutional: Negative for fever, chills, diaphoresis, activity change, appetite change and fatigue. HENT: Negative for ear pain, nosebleeds, congestion, facial swelling, rhinorrhea, neck pain, neck stiffness and ear discharge.  Eyes: Negative for pain, discharge, redness, itching and visual disturbance. Respiratory: Negative for cough, choking, chest tightness, shortness of breath, wheezing and stridor.  Cardiovascular: Negative for chest pain, palpitations and leg swelling. Gastrointestinal: Negative for abdominal distention. Genitourinary:  Negative for dysuria, urgency, frequency, hematuria, flank pain, decreased urine volume, difficulty urinating. Musculoskeletal: Negative for back pain, joint swelling, arthralgia and gait problem. Neurological: Negative for dizziness, tremors, seizures, syncope, facial asymmetry, speech difficulty, weakness, light-headedness, numbness and headaches.  Hematological: Negative for adenopathy. Does not bruise/bleed easily. Psychiatric/Behavioral: Negative for hallucinations, behavioral problems, confusion, dysphoric mood, decreased concentration and agitation.    Objective:   Vitals:   01/12/18 1617 01/12/18 1658  BP: (!) 186/91 (!) 160/95  Pulse: (!) 102 95  Resp:    Temp:    SpO2:  92%    BP Readings from Last 3 Encounters:  01/12/18 (!) 176/104  01/06/18 (!) 181/108  12/21/17 (!) 188/80    Filed Weights   01/12/18 1415  Weight: 213 lb 9.6 oz (96.9 kg)      Physical Exam: Constitutional: Patient appears well-developed and well-nourished. No distress. HENT: Normocephalic, atraumatic, External right and left ear normal. Oropharynx is clear and moist.  Eyes: Conjunctivae and EOM are normal. PERRLA, no scleral icterus. Neck: Normal ROM. Neck supple. No JVD. No tracheal deviation. No thyromegaly. CVS: Tachycardic, no murmurs, no gallops, no  carotid bruit.  Pulmonary: Tachypnea present at times with ambulation. Diminished lung sounds. Negative wheezes or rales.  Abdominal: Soft. BS +, no distension, tenderness, rebound or guarding.  Musculoskeletal: Normal range of motion. No edema and no tenderness.  Neuro: Alert. Normal muscle tone coordination. Normal gait. BUE and BLE strength 5/5. Bilateral hand grips symmetrical. Skin: Severely dry. See image below ulceration of lower left extremity.  Psychiatric: Normal mood and affect. Behavior, judgment, thought content normal.       Lab Results (prior encounters)  Lab Results  Component Value Date   WBC 6.9 01/06/2018   HGB 9.3 (L) 01/06/2018   HCT 30.3 (L) 01/06/2018   MCV 94.7 01/06/2018   PLT 119 (L) 01/06/2018   Lab Results  Component Value Date   CREATININE 2.88 (H) 01/06/2018   BUN 50 (H) 01/06/2018   NA 145 01/06/2018   K 3.8 01/06/2018   CL 111 01/06/2018   CO2 26 01/06/2018    Lab Results  Component Value Date   HGBA1C 6.3 (H) 11/03/2017       Component Value Date/Time   CHOL 160 11/04/2017 0405   TRIG 54 11/04/2017 0405   HDL 52 11/04/2017 0405   CHOLHDL 3.1 11/04/2017 0405   VLDL 11 11/04/2017 0405   LDLCALC 97 11/04/2017 0405        Assessment and plan:  1. Encounter to establish care 2. Accelerated hypertension  BP Readings from Last 3 Encounters:  01/12/18 (!) 160/95  01/06/18 (!) 181/108  12/21/17 (!) 188/80   Patient presented today with multiple medication in a bag and some are empty. It is unclear of the medications she is taking. She has multiple comorbidities and has dypsnea on exam, tachycardia up to 103 at times, and severely elevated blood pressure. Pulse ox as low as 91%. She has remained in the office for over 2 hours and blood pressure has remained severely elevated after 0.2 mg clonidine. Abnormal chest x-ray 12/21/2017 significant for pulmonary I have contacted EMS to transport patient to ER for further evaluation and work-up.  (no EKG available at site today -new office)  3. Type 2 diabetes mellitus with stage 3 chronic kidney disease, with long-term current use of insulin (HCC) -Resumed home medications. No glucometer available in office today. She has a glucometer at home, however doesn't routine check glucose. Resuming Victoza  at previously prescribed dose of 1.8 mg daily Resuming Lantus 25 units once daily.  No prior trend of blood sugars, therefore will monitor closely and reduce doses as blood sugar readings improve. Last A1C 6.3 from August. Return in 5-7 days will obtain an A1C at that time.  4. Chronic kidney disease (CKD), stage IV (severe) (HCC) -referral placed neprhology -avoid nephrotoxic medications  5. 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 Chronic congestive heart failure, unspecified heart failure type (HCC), recent chest x-ray indicated mild pulmonary edema. Patient complains of intermittent dyspnea. - Ambulatory referral to Cardiology  Orders Placed This Encounter  Procedures  . Ambulatory referral to Cardiology    Referral Priority:   Routine    Referral Type:   Consultation    Referral Reason:   Specialty Services Required    Requested Specialty:   Cardiology    Number of Visits Requested:   1  . Ambulatory referral to Nephrology    Referral Priority:   Routine    Referral Type:   Consultation    Referral Reason:   Specialty Services Required    Requested Specialty:   Nephrology    Number of Visits Requested:   1  . Ambulatory referral to Orthopedic Surgery    Referral Priority:   Urgent    Referral Type:   Surgical    Referral Reason:   Second Opinion    Requested Specialty:   Orthopedic Surgery    Number of Visits Requested:   3    Meds ordered this encounter  Medications  . cloNIDine (CATAPRES) tablet 0.1 mg  . amLODipine (NORVASC) 10 MG tablet    Sig: Take 1 tablet (10 mg total) by mouth  daily.    Dispense:  30 tablet    Refill:  2  . carvedilol (COREG) 25 MG tablet    Sig: Take 2 tablets (50 mg total) by mouth 2 (two) times daily with a meal.    Dispense:  120 tablet    Refill:  0  . atorvastatin (LIPITOR) 40 MG tablet    Sig: Take 1 tablet (40 mg total) by mouth daily.    Dispense:  30 tablet    Refill:  2  . isosorbide mononitrate (IMDUR) 30 MG 24 hr tablet    Sig: Take 1 tablet (30 mg total) by mouth daily.    Dispense:  30 tablet    Refill:  2  . potassium chloride SA (K-DUR,KLOR-CON) 20 MEQ tablet    Sig: Take 1 tablet (20 mEq total) by mouth every Monday, Wednesday, and Friday.    Dispense:  30 tablet    Refill:  0  . furosemide (LASIX) 40 MG tablet    Sig: Take 1 tablet (40 mg total) by mouth daily.    Dispense:  30 tablet    Refill:  1  . hydrALAZINE (APRESOLINE) 50 MG tablet    Sig: Take 1 tablet (50 mg total) by mouth 3 (three) times daily.    Dispense:  90 tablet    Refill:  1  . liraglutide (VICTOZA) 18 MG/3ML SOPN    Sig: Inject 0.3 mLs (1.8 mg total) into the skin every evening.    Dispense:  3 pen    Refill:  1  . pantoprazole (PROTONIX) 40 MG tablet    Sig: Take 1 tablet (40 mg total) by mouth daily.    Dispense:  30 tablet  Refill:  3  . cloNIDine (CATAPRES) tablet 0.1 mg   -transported via EMS to ER  A total of 45 minutes spent, greater than 50 % of this time was spent counseling and coordination of care.   Return in about 5 days (around 01/17/2018) for follow-up diabetes and hypertension .   The patient was given clear instructions to go to ER or return to medical center if symptoms don't improve, worsen or new problems develop. The patient verbalized understanding. The patient was advised  to call and obtain lab results if they haven't heard anything from out office within 7-10 business days.  Joaquin Courts, FNP Primary Care at [facility] [address] [place] [address] [phone]: [phone]       This note has been created with Dragon speech recognition software and Paediatric nurse. Any transcriptional errors are unintentional.

## 2018-01-12 NOTE — ED Provider Notes (Signed)
[facility]-EMERGENCY DEPT Provider Note   CSN: [id number] Arrival date & time: 01/12/18  1750     History   Chief Complaint No chief complaint on file.   HPI Barbara Velazquez is a 57 y.o. female.  Pt presents to the ED today with elevated bp.  The pt went to a pcp today to establish primary care.  She has recently moved from [facility].  She said she's been taking all meds as directed, but will run out by the end of this week.  She said her bp and bs have been high.  NO CP.  Pt's med list said hydralazine 50 mg TID, but her pill bottles say 25 mg TID.  She's not been taking the 50 mg.     Past Medical History:  Diagnosis Date  . CHF (congestive heart failure) (HCC)   . Diabetes mellitus without complication (HCC)   . Hypertension     Patient Active Problem List   Diagnosis Date Noted  . Hyperkalemia 11/03/2017  . Thrombocytopenia (HCC) 11/09/2016  . Anemia 11/09/2016  . Hypertension 11/09/2016  . 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 CKD (chronic kidney disease), stage III (HCC) 11/09/2016    Past Surgical History:  Procedure Laterality Date  . CHOLECYSTECTOMY       OB History   None      Home Medications    Prior to Admission medications   Medication Sig Start Date End Date Taking? Authorizing Provider  albuterol (PROVENTIL HFA;VENTOLIN HFA) 108 (90 Base) MCG/ACT inhaler Inhale 2 puffs into the lungs every 4 (four) hours as needed for wheezing or shortness of breath. 11/13/16  Yes Alison Murray, MD  allopurinol (ZYLOPRIM) 100 MG tablet Take 1 tablet (100 mg total) by mouth daily. Patient taking differently: Take 100 mg by mouth 2 (two) times daily.  11/13/16  Yes Alison Murray, MD  amLODipine (NORVASC) 10 MG tablet Take 1 tablet (10 mg total) by mouth daily. 01/12/18  Yes Bing Neighbors, FNP  aspirin 81 MG chewable tablet Chew 81 mg by mouth daily.   Yes [provider]  atorvastatin (LIPITOR) 40 MG  tablet Take 1 tablet (40 mg total) by mouth daily. 01/12/18  Yes Bing Neighbors, FNP  carvedilol (COREG) 25 MG tablet Take 2 tablets (50 mg total) by mouth 2 (two) times daily with a meal. 01/12/18 02/11/18 Yes Bing Neighbors, FNP  cholecalciferol (VITAMIN D) 1000 units tablet Take 1,000 Units by mouth 2 (two) times daily.    Yes [provider]  ferrous sulfate 325 (65 FE) MG EC tablet Take 325 mg by mouth daily with breakfast.   Yes [provider]  furosemide (LASIX) 40 MG tablet Take 1 tablet (40 mg total) by mouth daily. 01/12/18  Yes Bing Neighbors, FNP  hydrALAZINE (APRESOLINE) 50 MG tablet Take 1 tablet (50 mg total) by mouth 3 (three) times daily. 01/12/18 02/11/18 Yes Bing Neighbors, FNP  Insulin Glargine (BASAGLAR KWIKPEN) 100 UNIT/ML SOPN Inject 25 Units into the skin at bedtime.   Yes [provider]  isosorbide mononitrate (IMDUR) 30 MG 24 hr tablet Take 1 tablet (30 mg total) by mouth daily. 01/12/18 04/12/18 Yes Bing Neighbors, FNP  levothyroxine (SYNTHROID, LEVOTHROID) 25 MCG tablet Take 25 mcg by mouth daily before breakfast.   Yes [provider]  liraglutide (VICTOZA) 18 MG/3ML SOPN Inject 0.3 mLs (1.8 mg total) into the skin every evening. 01/12/18  Yes Bing Neighbors, FNP  losartan (COZAAR) 50 MG tablet Take 50 mg by mouth daily.   Yes [provider]  metolazone (ZAROXOLYN) 2.5 MG tablet Take 2.5 mg by mouth daily. 10/11/17  Yes [provider]  pantoprazole (PROTONIX) 40 MG tablet Take 1 tablet (40 mg total) by mouth daily. 01/12/18  Yes Bing Neighbors, FNP  potassium chloride SA (K-DUR,KLOR-CON) 20 MEQ tablet Take 1 tablet (20 mEq total) by mouth every Monday, Wednesday, and Friday. 01/13/18  Yes Bing Neighbors, FNP  sodium bicarbonate 650 MG tablet Take 650 mg by mouth daily.   Yes [provider]  dicyclomine (BENTYL) 20 MG tablet Take 20 mg by mouth as needed for spasms (diarrhea).   07/25/17   [provider]    Family History Family History  Problem Relation Age of Onset  . Diabetes Mother   . Hypertension Mother   . Breast cancer Mother   . Diabetes Father   . Hypertension Father   . Pancreatic cancer Father   . Stroke Neg Hx     Social History Social History   Tobacco Use  . Smoking status: Never Smoker  . Smokeless tobacco: Never Used  Substance Use Topics  . Alcohol use: No  . Drug use: No     Allergies   Patient has no known allergies.   Review of Systems Review of Systems  All other systems reviewed and are negative.    Physical Exam Updated Vital Signs BP (!) 171/92 (BP Location: Right Arm)   Pulse 95   Temp 97.6 F (36.4 C) (Oral)   Resp (!) 24   SpO2 92%   Physical Exam  Constitutional: She is oriented to person, place, and time. She appears well-developed and well-nourished.  HENT:  Head: Normocephalic and atraumatic.  Right Ear: External ear normal.  Left Ear: External ear normal.  Nose: Nose normal.  Mouth/Throat: Oropharynx is clear and moist.  Eyes: Pupils are equal, round, and reactive to light. Conjunctivae and EOM are normal.  Neck: Normal range of motion. Neck supple.  Cardiovascular: Normal rate, regular rhythm, normal heart sounds and intact distal pulses.  Pulmonary/Chest: Effort normal and breath sounds normal.  Abdominal: Soft. Bowel sounds are normal.  Musculoskeletal: Normal range of motion.  Neurological: She is alert and oriented to person, place, and time.  Skin: Skin is warm. Capillary refill takes less than 2 seconds.  Healing wound left leg  Psychiatric: She has a normal mood and affect. Her behavior is normal. Judgment and thought content normal.  Nursing note and vitals reviewed.    ED Treatments / Results  Labs (all labs ordered are listed, but only abnormal results are displayed) Labs Reviewed  COMPREHENSIVE METABOLIC PANEL - Abnormal; Notable for the following components:       Result Value   Sodium 147 (*)    Chloride 114 (*)    Glucose, Bld 190 (*)    BUN 42 (*)    Creatinine, Ser 2.68 (*)    Calcium 8.7 (*)    Total Protein 6.0 (*)    Albumin 3.2 (*)    GFR calc non Af Amer 18 (*)    GFR calc Af Amer 21 (*)    All other components within normal limits  CBC WITH DIFFERENTIAL/PLATELET - Abnormal; Notable for the following components:   RBC 3.25 (*)    Hemoglobin 9.4 (*)    HCT 30.8 (*)    RDW 15.8 (*)  Platelets 141 (*)    All other components within normal limits  URINALYSIS, ROUTINE W REFLEX MICROSCOPIC - Abnormal; Notable for the following components:   Glucose, UA 150 (*)    Protein, ur >=300 (*)    Bacteria, UA RARE (*)    All other components within normal limits    EKG EKG Interpretation  Date/Time:  Thursday January 12 2018 18:09:35 EDT Ventricular Rate:  92 PR Interval:    QRS Duration: 80 QT Interval:  397 QTC Calculation: 492 R Axis:   104 Text Interpretation:  Sinus rhythm Right axis deviation Consider left ventricular hypertrophy Nonspecific T abnormalities, inferior leads Borderline prolonged QT interval Baseline wander in lead(s) II III aVF No significant change since last tracing Confirmed by Jacalyn Lefevre [phone]) on 01/12/2018 6:37:42 PM   Radiology No results found.  Procedures Procedures (including critical care time)  Medications Ordered in ED Medications  labetalol (NORMODYNE,TRANDATE) injection 20 mg (20 mg Intravenous Given 01/12/18 1927)  hydrALAZINE (APRESOLINE) injection 20 mg (20 mg Intravenous Given 01/12/18 2142)     Initial Impression / Assessment and Plan / ED Course  I have reviewed the triage vital signs and the nursing notes.  Pertinent labs & imaging results that were available during my care of the patient were reviewed by me and considered in my medical decision making (see chart for details).     Pt's bp has improved.  She is instructed to increase hydralazine to 50 mg tid.  Return if  worse.  Final Clinical Impressions(s) / ED Diagnoses   Final diagnoses:  Essential hypertension  CKD (chronic kidney disease) stage 4, GFR 15-29 ml/min [facility])  Hyperglycemia    ED Discharge Orders    None       Jacalyn Lefevre, MD 01/12/18 2323

## 2018-01-12 NOTE — Discharge Instructions (Addendum)
Take hydralazine 50 mg 3 times a day.

## 2018-01-17 ENCOUNTER — Ambulatory Visit (INDEPENDENT_AMBULATORY_CARE_PROVIDER_SITE_OTHER): Payer: Self-pay | Admitting: Family Medicine

## 2018-01-17 ENCOUNTER — Encounter: Payer: Self-pay | Admitting: Family Medicine

## 2018-01-17 VITALS — BP 114/72 | HR 83 | Resp 19 | Ht 64.0 in | Wt 212.8 lb

## 2018-01-17 DIAGNOSIS — Z794 Long term (current) use of insulin: Secondary | ICD-10-CM

## 2018-01-17 DIAGNOSIS — D638 Anemia in other chronic diseases classified elsewhere: Secondary | ICD-10-CM

## 2018-01-17 DIAGNOSIS — R0902 Hypoxemia: Secondary | ICD-10-CM

## 2018-01-17 DIAGNOSIS — N184 Chronic kidney disease, stage 4 (severe): Secondary | ICD-10-CM

## 2018-01-17 DIAGNOSIS — E1122 Type 2 diabetes mellitus with diabetic chronic kidney disease: Secondary | ICD-10-CM

## 2018-01-17 MED ORDER — ALBUTEROL SULFATE HFA 108 (90 BASE) MCG/ACT IN AERS: 2.0000 | INHALATION_SPRAY | Freq: Four times a day (QID) | RESPIRATORY_TRACT | 0 refills | Status: AC | PRN

## 2018-01-17 NOTE — Patient Instructions (Signed)
Home Oxygen Use, Adult When a medical condition keeps you from getting enough oxygen, your health care provider may instruct you to take extra oxygen at home. Your health care provider will let you know:  When to take oxygen.  For how long to take oxygen.  How quickly oxygen should be delivered (flow rate), in liters per minute (LPM or L/M).  Home oxygen can be given through:  A mask.  A nasal cannula. This is a device or tube that goes in the nostrils.  A transtracheal catheter. This is a small, flexible tube placed in the trachea.  A tracheostomy. This is a surgically made opening in the trachea.  These devices are connected with tubing to an oxygen source, such as:  A tank. Tanks hold oxygen in gas form. They must be replaced when the oxygen is used up.  A liquid oxygen device. This holds oxygen in liquid form. It must be replaced when the oxygen is used up.  An oxygen concentrator machine. This filters oxygen in the room. It uses electricity, so you must have a backup cylinder of oxygen in case the power goes out.  Supplies needed: To use oxygen, you will need:  A mask, nasal cannula, transtracheal catheter, or tracheostomy.  An oxygen tank, a liquid oxygen device, or an oxygen concentrator.  The tape that your health care provider recommends (optional).  If you use a transtracheal catheter and your prescribed flow rate is 1 LPM or greater, you will also need a humidifier. Risks and complications  Fire. This can happen if the oxygen is exposed to a heat source, flame, or spark.  Injury to skin. This can happen if liquid oxygen touches your skin.  Organ damage. This can happen if you get too little oxygen. How to use oxygen Your health care provider will show you how to use your oxygen device. Follow her or his instructions. They may look something like this: 1. Wash your hands. 2. If you use an oxygen concentrator, make sure it is plugged in. 3. Place one end  of the tube into the port on the tank, device, or machine. 4. Place the mask over your nose and mouth. Or, place the nasal cannula and secure it with tape if instructed. If you use a tracheostomy or transtracheal catheter, connect it to the oxygen source as directed. 5. Make sure the liter-flow setting on the machine is at the level prescribed by your health care provider. 6. Turn on the machine or adjust the knob on the tank or device to the correct liter-flow setting. 7. When you are done, turn off and unplug the machine, or turn the knob to OFF.  How to clean and care for the oxygen supplies Nasal cannula  Clean it with a warm, wet cloth daily or as needed.  Wash it with a liquid soap once a week.  Rinse it thoroughly once or twice a week.  Replace it every 2-4 weeks.  If you have an infection, such as a cold or pneumonia, change the cannula when you get better. Mask  Replace it every 2-4 weeks.  If you have an infection, such as a cold or pneumonia, change the mask when you get better. Humidifier bottle  Wash the bottle between each refill: ? Wash it with soap and warm water. ? Rinse it thoroughly. ? Disinfect it and its top. ? Air-dry it.  Make sure it is dry before you refill it. Oxygen concentrator  Clean the air  filter at least twice a week according to directions from your home medical equipment and service company.  Wipe down the cabinet every day. To do this: ? Unplug the unit. ? Wipe down the cabinet with a damp cloth. ? Dry the cabinet. Other equipment  Change any extra tubing every 1-3 months.  Follow instructions from your health care provider about taking care of any other equipment. Safety tips Fire safety tips   Keep your oxygen and oxygen supplies at least 5 ft away from sources of heat, flames, and sparks at all times.  Do not allow smoking near your oxygen. Put up "no smoking" signs in your home.  Do not use materials that can burn (are  flammable) while you use oxygen.  When you go to a restaurant with portable oxygen, ask to be seated in the nonsmoking section.  Keep a Government social research officer close by. Let your fire department know that you have oxygen in your home.  Test your home smoke detectors regularly. General safety tips  If you use an oxygen cylinder, make sure it is in a stand or secured to an object that will not move (fixed object).  If you use liquid oxygen, make sure its container is kept upright.  If you use an oxygen concentrator: ? Catering manager company. Make sure you are given priority service in the event that your power goes out. ? Avoid using extension cords, if possible. Follow these instructions at home:  Use oxygen only as told by your health care provider.  Do not use alcohol or other drugs that make you relax (sedating drugs) unless instructed. They can slow down your breathing rate and make it hard to get in enough oxygen.  Know how and when to order a refill of oxygen.  Always keep a spare tank of oxygen. Plan ahead for holidays when you may not be able to get a prescription filled.  Use water-based lubricants on your lips or nostrils. Do not use oil-based products like petroleum jelly.  To prevent skin irritation on your cheeks or behind your ears, tuck some gauze under the tubing. Contact a health care provider if:  You get headaches often.  You have shortness of breath.  You have a lasting cough.  You have anxiety.  You are sleepy all the time.  You develop an illness that affects your breathing.  You cannot exercise at your regular level.  You are restless.  You have difficult or irregular breathing, and it is getting worse.  You have a fever.  You have persistent redness under your nose. Get help right away if:  You are confused.  You have blue lips or fingernails.  You are struggling to breathe. This information is not intended to replace advice given to you  by your health care provider. Make sure you discuss any questions you have with your health care provider. Document Released: 05/22/2003 Document Revised: 10/29/2015 Document Reviewed: 09/23/2015 Elsevier Interactive Patient Education  2018 [employer].     Hypoxia Hypoxia is a condition that happens when there is a lack of oxygen in the body's tissues and organs. When there is not enough oxygen, organs cannot work as they should. This causes serious problems throughout the body and in the brain. What are the causes? This condition may be caused by:  Exposure to high altitude.  A collapsed lung (pneumothorax).  Lung infection (pneumonia).  Lung injury.  Long-term (chronic) lung disease, such as COPD (chronic obstructive pulmonary disease).  Blood collecting in the chest cavity (hemothorax).  Food, saliva, or vomit getting into the airway (aspiration).  Reduced blood flow (ischemia).  Severe blood loss.  Slow or shallow breathing (hypoventilation).  Blood disorders, such as anemia.  Carbon monoxide poisoning.  The heart suddenly stopping (cardiac arrest).  Anesthetic medicines.  Drowning.  Choking.  What are the signs or symptoms? Symptoms of this condition include:  Headache.  Fatigue.  Drowsiness.  Forgetfulness.  Nausea.  Confusion.  Shortness of breath.  Dizziness.  Bluish color of the skin, lips, or nail beds (cyanosis).  Change in consciousness or awareness.  If hypoxia is not treated, it can lead to convulsions, loss of consciousness (coma), or brain damage. How is this diagnosed? This condition may be diagnosed based on:  A physical exam.  Blood tests.  A test that measures how much oxygen is in your blood (pulse oximetry). This is done with a sensor that is placed on your finger, toe, or earlobe.  Chest X-ray.  Tests to check your lung function (pulmonary function tests).  A test to check the electrical activity of your heart  (electrocardiogram, ECG).  You may have other tests to determine the cause of your hypoxia. How is this treated? Treatment for this condition depends on what is causing the hypoxia. You will likely be treated with oxygen therapy. This may be done by giving you oxygen through a face mask or through tubes in your nose. Your health care provider may also recommend other therapies to treat the underlying cause of your hypoxia. Follow these instructions at home:  Take over-the-counter and prescription medicines only as told by your health care provider.  Do not use any products that contain nicotine or tobacco, such as cigarettes and e-cigarettes. If you need help quitting, ask your health care provider.  Avoid secondhand smoke.  Work with your health care provider to manage any chronic conditions you have that may be causing hypoxia, such as COPD.  Keep all follow-up visits as told by your health care provider. This is important. Contact a health care provider if:  You have a fever.  You have trouble breathing, even after treatment.  You become extremely short of breath when you exercise. Get help right away if:  Your shortness of breath gets worse, especially with normal or very little activity.  Your skin, lips, or nail beds have a bluish color.  You become confused or you cannot think properly.  You have chest pain. Summary  Hypoxia is a condition that happens when there is a lack of oxygen in the body's tissues and organs.  If hypoxia is not treated, it can lead to convulsions, loss of consciousness (coma), or brain damage.  Symptoms of hypoxia can include a headache, shortness of breath, confusion, nausea, and a bluish skin color.  Hypoxia has many possible causes, including exposure to high altitude, carbon monoxide poisoning, or other health issues, such as blood disorders or cardiac arrest.  Hypoxia is usually treated with oxygen therapy. This information is not  intended to replace advice given to you by your health care provider. Make sure you discuss any questions you have with your health care provider. Document Released: 04/19/2016 Document Revised: 04/19/2016 Document Reviewed: 04/19/2016 Elsevier Interactive Patient Education  2018 [employer].

## 2018-01-17 NOTE — Progress Notes (Signed)
Patient ID: Barbara Velazquez, female    DOB: Nov 14, 1960, 57 y.o.   MRN: [id number]  PCP: Bing Neighbors, FNP  Chief Complaint  Patient presents with  . Diabetes  . Hypertension  . Shortness of Breath    is still having SHOB with exertion.    Subjective:  HPI Barbara Velazquez is a 57 y.o. female presents for follow-up of hypertension, diabetes, and shortness of breath at rest and with activity.   Hypertension and Shortness of Breath Last office visit, patient was extremely hypertensive and dyspneic. She was sent to the ER via EMS from office for further evaluation. Blood pressure decreased and she was discharged to follow-up with me here in office today. She does not monitor her blood pressure at home. Major concern worsening shortness of breath at rest and with activity. She suffer from CHF and reports at some point in the past prior PCP recommended home oxygen. She is having difficulty at home completing ADL as she has to stop and sit-down in order to catch her breath.  Diabetes She has resumed Victoza, however she is not checking blood sugars at home. She reports she's unable to read the meter readings do to poor vision. Last A1C 6.3 2 months prior. Denies polyuria, polydipsia, or polyphagia. Compliant with current medication. Social History   Socioeconomic History  . Marital status: Divorced    Spouse name: Not on file  . Number of children: Not on file  . Years of education: Not on file  . Highest education level: Not on file  Occupational History  . Not on file  Social Needs  . Financial resource strain: Not on file  . Food insecurity:    Worry: Not on file    Inability: Not on file  . Transportation needs:    Medical: Not on file    Non-medical: Not on file  Tobacco Use  . Smoking status: Never Smoker  . Smokeless tobacco: Never Used  Substance and Sexual Activity  . Alcohol use: No  . Drug use: No  . Sexual activity: Not on file  Lifestyle  . Physical activity:   Days per week: Not on file    Minutes per session: Not on file  . Stress: Not on file  Relationships  . Social connections:    Talks on phone: Not on file    Gets together: Not on file    Attends religious service: Not on file    Active member of club or organization: Not on file    Attends meetings of clubs or organizations: Not on file    Relationship status: Not on file  . Intimate partner violence:    Fear of current or ex partner: Not on file    Emotionally abused: Not on file    Physically abused: Not on file    Forced sexual activity: Not on file  Other Topics Concern  . Not on file  Social History Narrative  . Not on file    Family History  Problem Relation Age of Onset  . Diabetes Mother   . Hypertension Mother   . Breast cancer Mother   . Diabetes Father   . Hypertension Father   . Pancreatic cancer Father   . Stroke Neg Hx    Review of Systems Pertinent negatives listed in HPI Patient Active Problem List   Diagnosis Date Noted  . Hyperkalemia 11/03/2017  . Thrombocytopenia (HCC) 11/09/2016  . Anemia 11/09/2016  . Hypertension 11/09/2016  . DM (diabetes mellitus),  type 2 with renal complications (HCC) 11/09/2016  . CKD (chronic kidney disease), stage III (HCC) 11/09/2016    No Known Allergies  Prior to Admission medications   Medication Sig Start Date End Date Taking? Authorizing Provider  amLODipine (NORVASC) 10 MG tablet Take 1 tablet (10 mg total) by mouth daily. 01/12/18  Yes Bing Neighbors, FNP  aspirin 81 MG chewable tablet Chew 81 mg by mouth daily.   Yes [provider]  atorvastatin (LIPITOR) 40 MG tablet Take 1 tablet (40 mg total) by mouth daily. 01/12/18  Yes Bing Neighbors, FNP  carvedilol (COREG) 25 MG tablet Take 2 tablets (50 mg total) by mouth 2 (two) times daily with a meal. 01/12/18 02/11/18 Yes Bing Neighbors, FNP  furosemide (LASIX) 40 MG tablet Take 1 tablet (40 mg total) by mouth daily. 01/12/18  Yes Bing Neighbors, FNP  hydrALAZINE (APRESOLINE) 50 MG tablet Take 1 tablet (50 mg total) by mouth 3 (three) times daily. 01/12/18 02/11/18 Yes Bing Neighbors, FNP  isosorbide mononitrate (IMDUR) 30 MG 24 hr tablet Take 1 tablet (30 mg total) by mouth daily. 01/12/18 04/12/18 Yes Bing Neighbors, FNP  liraglutide (VICTOZA) 18 MG/3ML SOPN Inject 0.3 mLs (1.8 mg total) into the skin every evening. 01/12/18  Yes Bing Neighbors, FNP  pantoprazole (PROTONIX) 40 MG tablet Take 1 tablet (40 mg total) by mouth daily. 01/12/18  Yes Bing Neighbors, FNP  potassium chloride SA (K-DUR,KLOR-CON) 20 MEQ tablet Take 1 tablet (20 mEq total) by mouth every Monday, Wednesday, and Friday. 01/13/18  Yes Bing Neighbors, FNP  albuterol (PROVENTIL HFA;VENTOLIN HFA) 108 (90 Base) MCG/ACT inhaler Inhale 2 puffs into the lungs every 4 (four) hours as needed for wheezing or shortness of breath. 11/13/16   Alison Murray, MD  allopurinol (ZYLOPRIM) 100 MG tablet Take 1 tablet (100 mg total) by mouth daily. Patient taking differently: Take 100 mg by mouth 2 (two) times daily.  11/13/16   Alison Murray, MD  cholecalciferol (VITAMIN D) 1000 units tablet Take 1,000 Units by mouth 2 (two) times daily.     [provider]  dicyclomine (BENTYL) 20 MG tablet Take 20 mg by mouth as needed for spasms (diarrhea).  07/25/17   [provider]  ferrous sulfate 325 (65 FE) MG EC tablet Take 325 mg by mouth daily with breakfast.    [provider]  metolazone (ZAROXOLYN) 2.5 MG tablet Take 2.5 mg by mouth daily. 10/11/17   [provider]    Past Medical, Surgical Family and Social History reviewed and updated.    Objective:   Today's Vitals   01/17/18 1405  BP: 114/72  Pulse: 83  Resp: 19  SpO2: 93%  Weight: 212 lb 12.8 oz (96.5 kg)  Height: 5\' 4"  (1.626 m)    Wt Readings from Last 3 Encounters:  01/17/18 212 lb 12.8 oz (96.5 kg)  01/12/18 213 lb 9.6 oz (96.9 kg)  12/21/17 214 lb (97.1  kg)     Physical Exam General appearance: alert, well developed, well nourished, cooperative and in no distress Head: Normocephalic, without obvious abnormality, atraumatic Respiratory: Respirations even and labored with activity and improves with rest. Diminished lung sound throughout lung fields.  Heart: rate and rhythm normal. No gallop or murmurs noted on exam  Extremities: No gross deformities. Trace edema present. Skin: Skin color, texture, turgor normal. No rashes seen  Psych: Appropriate mood and affect. Neurologic: Mental status: Alert, oriented to person,  place, and time, thought content appropriate.  Oxygen qualification test: Oxygen status assessment. 6 minute walk without oxygen if tolerated, SPO2 saturation 89%-90%  6 minute walk with 2  liters of oxygen,  SPO2 saturation 96-98%.   No results found for: POCGLU  Lab Results  Component Value Date   HGBA1C 6.3 (H) 11/03/2017       Assessment & Plan:  1. CKD (chronic kidney disease) stage 4, GFR 15-29 ml/min [facility]) -Nephrology referral pending. Checking magnesium and phosphorus today -Continue to avoid nephrotoxic medications.  2. Anemia, chronic disease Check CBC and Iron, TIBC and Ferritin Panel  3. Type 2 diabetes mellitus with stage 4 chronic kidney disease, with long-term current use of insulin (HCC) -Checking Hemoglobin A1c and Continue Victoza. Controlled 2 months prior with an A1C 6.3  4. Hypoxia  Six minute walk indicated hypoxia with drop in saturations 89%-90% in the presence of severe dyspnea. Previously prescribed Albuterol for dyspnea without improvement. Patient has CHF and severe renal disease  Patient is without a health insurance at present. Will refer recommendation to our internal case manager RN to inquire on available resources for patient to obtain home oxygen. Recommendations are for home oxygen 2 liters as need while awake and during the night..   Orders Placed This Encounter  Procedures  .  Hemoglobin A1c    Standing Status:   Future    Standing Expiration Date:   01/18/2019  . Iron, TIBC and Ferritin Panel    Standing Status:   Future    Standing Expiration Date:   01/18/2019  . Magnesium    Standing Status:   Future    Standing Expiration Date:   01/18/2019  . Phosphorus    Standing Status:   Future    Standing Expiration Date:   01/18/2019    Meds ordered this encounter  Medications  . albuterol (PROVENTIL HFA;VENTOLIN HFA) 108 (90 Base) MCG/ACT inhaler    Sig: Inhale 2 puffs into the lungs every 6 (six) hours as needed for wheezing or shortness of breath.    Dispense:  1 Inhaler    Refill:  0    Unable to have labs drawn as patient is a difficult stick. She will return next week for lab draw with phlebotomy.   -The patient was given clear instructions to go to ER or return to medical center if symptoms do not improve, worsen or new problems develop. The patient verbalized understanding.   A total of 35 minutes spent, greater than 50 % of this time was spent counseling and coordination of care.     Joaquin Courts, FNP Primary Care at [facility] [address] [place] [address] [phone]: [phone]

## 2018-01-19 ENCOUNTER — Telehealth: Payer: Self-pay | Admitting: Family Medicine

## 2018-01-19 DIAGNOSIS — J9621 Acute and chronic respiratory failure with hypoxia: Secondary | ICD-10-CM | POA: Insufficient documentation

## 2018-01-19 NOTE — Telephone Encounter (Signed)
Barbara Velazquez,  Could you assist me with resources for uninsured patients to obtain home oxygen if available? This patient performed a 6 minute oxygenation evaluation in office which indicated the need for home oxygen. Any assistance you can provide would be much appreciated.  Joaquin Courts, FNP Primary Care at [facility] [address] [place] [address] [phone]: [phone]

## 2018-01-20 ENCOUNTER — Encounter (HOSPITAL_BASED_OUTPATIENT_CLINIC_OR_DEPARTMENT_OTHER): Payer: Self-pay | Attending: Internal Medicine

## 2018-01-26 ENCOUNTER — Ambulatory Visit: Payer: Self-pay | Admitting: Family Medicine

## 2018-02-08 ENCOUNTER — Ambulatory Visit: Payer: Self-pay | Admitting: Family Medicine

## 2018-02-18 ENCOUNTER — Emergency Department (HOSPITAL_COMMUNITY): Payer: Self-pay

## 2018-02-18 ENCOUNTER — Inpatient Hospital Stay (HOSPITAL_COMMUNITY)
Admission: EM | Admit: 2018-02-18 | Discharge: 2018-03-01 | DRG: 291 | Disposition: A | Discharge status: Home / Self Care | Payer: Self-pay | Attending: Internal Medicine | Admitting: Internal Medicine

## 2018-02-18 ENCOUNTER — Other Ambulatory Visit: Payer: Self-pay

## 2018-02-18 ENCOUNTER — Encounter (HOSPITAL_COMMUNITY): Payer: Self-pay

## 2018-02-18 DIAGNOSIS — E1122 Type 2 diabetes mellitus with diabetic chronic kidney disease: Secondary | ICD-10-CM | POA: Diagnosis present

## 2018-02-18 DIAGNOSIS — Z09 Encounter for follow-up examination after completed treatment for conditions other than malignant neoplasm: Secondary | ICD-10-CM

## 2018-02-18 DIAGNOSIS — R402362 Coma scale, best motor response, obeys commands, at arrival to emergency department: Secondary | ICD-10-CM | POA: Diagnosis present

## 2018-02-18 DIAGNOSIS — D638 Anemia in other chronic diseases classified elsewhere: Secondary | ICD-10-CM | POA: Diagnosis present

## 2018-02-18 DIAGNOSIS — E119 Type 2 diabetes mellitus without complications: Secondary | ICD-10-CM

## 2018-02-18 DIAGNOSIS — J810 Acute pulmonary edema: Secondary | ICD-10-CM

## 2018-02-18 DIAGNOSIS — R402252 Coma scale, best verbal response, oriented, at arrival to emergency department: Secondary | ICD-10-CM | POA: Diagnosis present

## 2018-02-18 DIAGNOSIS — I130 Hypertensive heart and chronic kidney disease with heart failure and stage 1 through stage 4 chronic kidney disease, or unspecified chronic kidney disease: Principal | ICD-10-CM | POA: Diagnosis present

## 2018-02-18 DIAGNOSIS — Z23 Encounter for immunization: Secondary | ICD-10-CM

## 2018-02-18 DIAGNOSIS — N179 Acute kidney failure, unspecified: Secondary | ICD-10-CM

## 2018-02-18 DIAGNOSIS — J9621 Acute and chronic respiratory failure with hypoxia: Secondary | ICD-10-CM | POA: Diagnosis present

## 2018-02-18 DIAGNOSIS — I5020 Unspecified systolic (congestive) heart failure: Secondary | ICD-10-CM

## 2018-02-18 DIAGNOSIS — D696 Thrombocytopenia, unspecified: Secondary | ICD-10-CM | POA: Diagnosis present

## 2018-02-18 DIAGNOSIS — N184 Chronic kidney disease, stage 4 (severe): Secondary | ICD-10-CM

## 2018-02-18 DIAGNOSIS — K5900 Constipation, unspecified: Secondary | ICD-10-CM | POA: Diagnosis not present

## 2018-02-18 DIAGNOSIS — R402142 Coma scale, eyes open, spontaneous, at arrival to emergency department: Secondary | ICD-10-CM | POA: Diagnosis present

## 2018-02-18 DIAGNOSIS — Z800 Family history of malignant neoplasm of digestive organs: Secondary | ICD-10-CM

## 2018-02-18 DIAGNOSIS — E669 Obesity, unspecified: Secondary | ICD-10-CM | POA: Diagnosis present

## 2018-02-18 DIAGNOSIS — R609 Edema, unspecified: Secondary | ICD-10-CM

## 2018-02-18 DIAGNOSIS — Z9989 Dependence on other enabling machines and devices: Secondary | ICD-10-CM

## 2018-02-18 DIAGNOSIS — Z803 Family history of malignant neoplasm of breast: Secondary | ICD-10-CM

## 2018-02-18 DIAGNOSIS — Z7982 Long term (current) use of aspirin: Secondary | ICD-10-CM

## 2018-02-18 DIAGNOSIS — E039 Hypothyroidism, unspecified: Secondary | ICD-10-CM | POA: Diagnosis present

## 2018-02-18 DIAGNOSIS — Z8249 Family history of ischemic heart disease and other diseases of the circulatory system: Secondary | ICD-10-CM

## 2018-02-18 DIAGNOSIS — I10 Essential (primary) hypertension: Secondary | ICD-10-CM

## 2018-02-18 DIAGNOSIS — I5033 Acute on chronic diastolic (congestive) heart failure: Secondary | ICD-10-CM

## 2018-02-18 DIAGNOSIS — R7989 Other specified abnormal findings of blood chemistry: Secondary | ICD-10-CM

## 2018-02-18 DIAGNOSIS — E785 Hyperlipidemia, unspecified: Secondary | ICD-10-CM | POA: Diagnosis present

## 2018-02-18 DIAGNOSIS — Z6835 Body mass index (BMI) 35.0-35.9, adult: Secondary | ICD-10-CM

## 2018-02-18 DIAGNOSIS — Z9049 Acquired absence of other specified parts of digestive tract: Secondary | ICD-10-CM

## 2018-02-18 DIAGNOSIS — G4733 Obstructive sleep apnea (adult) (pediatric): Secondary | ICD-10-CM

## 2018-02-18 DIAGNOSIS — Z833 Family history of diabetes mellitus: Secondary | ICD-10-CM

## 2018-02-18 DIAGNOSIS — I248 Other forms of acute ischemic heart disease: Secondary | ICD-10-CM | POA: Diagnosis present

## 2018-02-18 LAB — CBC WITH DIFFERENTIAL/PLATELET
Abs Immature Granulocytes: 0.08 10*3/uL — ABNORMAL HIGH (ref 0.00–0.07)
BASOS ABS: 0.1 10*3/uL (ref 0.0–0.1)
Basophils Relative: 1 %
EOS ABS: 0.2 10*3/uL (ref 0.0–0.5)
EOS PCT: 2 %
HCT: 34 % — ABNORMAL LOW (ref 36.0–46.0)
Hemoglobin: 10.4 g/dL — ABNORMAL LOW (ref 12.0–15.0)
Immature Granulocytes: 1 %
Lymphocytes Relative: 14 %
Lymphs Abs: 1.2 10*3/uL (ref 0.7–4.0)
MCH: 28.5 pg (ref 26.0–34.0)
MCHC: 30.6 g/dL (ref 30.0–36.0)
MCV: 93.2 fL (ref 80.0–100.0)
Monocytes Absolute: 0.7 10*3/uL (ref 0.1–1.0)
Monocytes Relative: 8 %
NRBC: 0 % (ref 0.0–0.2)
Neutro Abs: 6.3 10*3/uL (ref 1.7–7.7)
Neutrophils Relative %: 74 %
Platelets: 114 10*3/uL — ABNORMAL LOW (ref 150–400)
RBC: 3.65 MIL/uL — AB (ref 3.87–5.11)
RDW: 16.1 % — AB (ref 11.5–15.5)
WBC: 8.4 10*3/uL (ref 4.0–10.5)

## 2018-02-18 LAB — COMPREHENSIVE METABOLIC PANEL
ALBUMIN: 3.3 g/dL — AB (ref 3.5–5.0)
ALT: 23 U/L (ref 0–44)
ANION GAP: 7 (ref 5–15)
AST: 27 U/L (ref 15–41)
Alkaline Phosphatase: 88 U/L (ref 38–126)
BUN: 36 mg/dL — ABNORMAL HIGH (ref 6–20)
CO2: 21 mmol/L — AB (ref 22–32)
Calcium: 8.6 mg/dL — ABNORMAL LOW (ref 8.9–10.3)
Chloride: 118 mmol/L — ABNORMAL HIGH (ref 98–111)
Creatinine, Ser: 2.71 mg/dL — ABNORMAL HIGH (ref 0.44–1.00)
GFR calc Af Amer: 22 mL/min — ABNORMAL LOW (ref >60)
GFR calc non Af Amer: 19 mL/min — ABNORMAL LOW (ref >60)
GLUCOSE: 155 mg/dL — AB (ref 70–99)
POTASSIUM: 4.1 mmol/L (ref 3.5–5.1)
Sodium: 146 mmol/L — ABNORMAL HIGH (ref 135–145)
TOTAL PROTEIN: 6.1 g/dL — AB (ref 6.5–8.1)
Total Bilirubin: 0.6 mg/dL (ref 0.3–1.2)

## 2018-02-18 LAB — BRAIN NATRIURETIC PEPTIDE: B Natriuretic Peptide: 920 pg/mL — ABNORMAL HIGH (ref 0.0–100.0)

## 2018-02-18 LAB — TROPONIN I: Troponin I: 0.04 ng/mL (ref <0.03)

## 2018-02-18 MED ORDER — HYDRALAZINE HCL 20 MG/ML IJ SOLN: 10.0000 mg | INTRAMUSCULAR | Status: DC | PRN

## 2018-02-18 MED ORDER — INFLUENZA VAC SPLIT QUAD 0.5 ML IM SUSY
0.5000 mL | PREFILLED_SYRINGE | INTRAMUSCULAR | Status: AC
  Administered 2018-02-19: 0.5 mL via INTRAMUSCULAR

## 2018-02-18 MED ORDER — ENOXAPARIN SODIUM 40 MG/0.4ML ~~LOC~~ SOLN
40.0000 mg | Freq: Every day | SUBCUTANEOUS | Status: DC
  Administered 2018-02-18 – 2018-02-19 (×2): 40 mg via SUBCUTANEOUS
  Filled 2018-02-18 (×2): qty 0.4

## 2018-02-18 MED ORDER — FUROSEMIDE 10 MG/ML IJ SOLN
60.0000 mg | Freq: Once | INTRAMUSCULAR | Status: AC
  Administered 2018-02-18: 60 mg via INTRAVENOUS
  Filled 2018-02-18: qty 8

## 2018-02-18 MED ORDER — ACETAMINOPHEN 325 MG PO TABS
650.0000 mg | ORAL_TABLET | Freq: Four times a day (QID) | ORAL | Status: DC | PRN
  Administered 2018-02-19 (×2): 650 mg via ORAL
  Filled 2018-02-18 (×2): qty 2

## 2018-02-18 MED ORDER — ALBUTEROL SULFATE (2.5 MG/3ML) 0.083% IN NEBU: 3.0000 mL | INHALATION_SOLUTION | Freq: Four times a day (QID) | RESPIRATORY_TRACT | Status: DC | PRN

## 2018-02-18 MED ORDER — NITROGLYCERIN 2 % TD OINT
1.0000 [in_us] | TOPICAL_OINTMENT | Freq: Once | TRANSDERMAL | Status: AC
  Administered 2018-02-18: 1 [in_us] via TOPICAL
  Filled 2018-02-18: qty 1

## 2018-02-18 MED ORDER — FUROSEMIDE 10 MG/ML IJ SOLN
60.0000 mg | Freq: Two times a day (BID) | INTRAMUSCULAR | Status: DC
  Administered 2018-02-18 – 2018-02-19 (×2): 60 mg via INTRAVENOUS
  Filled 2018-02-18 (×2): qty 6

## 2018-02-18 MED ORDER — CARVEDILOL 25 MG PO TABS
50.0000 mg | ORAL_TABLET | Freq: Two times a day (BID) | ORAL | Status: DC
  Administered 2018-02-18 – 2018-02-24 (×12): 50 mg via ORAL
  Filled 2018-02-18 (×12): qty 2

## 2018-02-18 MED ORDER — ACETAMINOPHEN 650 MG RE SUPP: 650.0000 mg | Freq: Four times a day (QID) | RECTAL | Status: DC | PRN

## 2018-02-18 MED ORDER — ATORVASTATIN CALCIUM 40 MG PO TABS
40.0000 mg | ORAL_TABLET | Freq: Every day | ORAL | Status: DC
  Administered 2018-02-18 – 2018-02-28 (×11): 40 mg via ORAL
  Filled 2018-02-18 (×11): qty 1

## 2018-02-18 MED ORDER — PANTOPRAZOLE SODIUM 40 MG PO TBEC
40.0000 mg | DELAYED_RELEASE_TABLET | Freq: Every day | ORAL | Status: DC
  Administered 2018-02-18 – 2018-03-01 (×12): 40 mg via ORAL
  Filled 2018-02-18 (×12): qty 1

## 2018-02-18 MED ORDER — AMLODIPINE BESYLATE 10 MG PO TABS
10.0000 mg | ORAL_TABLET | Freq: Every day | ORAL | Status: DC
  Administered 2018-02-18: 10 mg via ORAL
  Filled 2018-02-18: qty 1

## 2018-02-18 MED ORDER — ISOSORBIDE MONONITRATE ER 30 MG PO TB24
30.0000 mg | ORAL_TABLET | Freq: Every day | ORAL | Status: DC
  Administered 2018-02-18 – 2018-02-19 (×2): 30 mg via ORAL
  Filled 2018-02-18 (×2): qty 1

## 2018-02-18 MED ORDER — LEVOTHYROXINE SODIUM 25 MCG PO TABS
25.0000 ug | ORAL_TABLET | Freq: Every day | ORAL | Status: DC
  Administered 2018-02-18 – 2018-03-01 (×12): 25 ug via ORAL
  Filled 2018-02-18 (×12): qty 1

## 2018-02-18 MED ORDER — HYDRALAZINE HCL 50 MG PO TABS
50.0000 mg | ORAL_TABLET | Freq: Three times a day (TID) | ORAL | Status: DC
  Administered 2018-02-18 (×2): 50 mg via ORAL
  Filled 2018-02-18 (×2): qty 1

## 2018-02-18 MED ORDER — ASPIRIN 81 MG PO CHEW
81.0000 mg | CHEWABLE_TABLET | Freq: Every day | ORAL | Status: DC
  Administered 2018-02-18 – 2018-03-01 (×12): 81 mg via ORAL
  Filled 2018-02-18 (×12): qty 1

## 2018-02-18 MED ORDER — ONDANSETRON HCL 4 MG/2ML IJ SOLN: 4.0000 mg | Freq: Four times a day (QID) | INTRAMUSCULAR | Status: DC | PRN

## 2018-02-18 MED ORDER — ONDANSETRON HCL 4 MG PO TABS: 4.0000 mg | ORAL_TABLET | Freq: Four times a day (QID) | ORAL | Status: DC | PRN

## 2018-02-18 NOTE — ED Notes (Signed)
Pt ambulatory to bathroom but became short of breath on exertion without O2.

## 2018-02-18 NOTE — ED Provider Notes (Signed)
[facility]-EMERGENCY DEPT Provider Note   CSN: [id number] Arrival date & time: 02/18/18  4098     History   Chief Complaint Chief Complaint  Patient presents with  . Shortness of Breath    HPI Ludmila Ebarb is a 57 y.o. female.  57 year old female with history of CHF presents with increasing dyspnea on exertion as well as orthopnea times several days.  Mild cough which is been nonproductive.  No fever or chills.  Some chest tightness is worse with inhalation.  Notes also increased lower extremity edema.  Does take diuretics daily and states that she has been compliant.  Symptoms are worse with exertion better with rest.     Past Medical History:  Diagnosis Date  . CHF (congestive heart failure) (HCC)   . Diabetes mellitus without complication (HCC)   . Hypertension     Patient Active Problem List   Diagnosis Date Noted  . Hypoxia 01/19/2018  . Hyperkalemia 11/03/2017  . Thrombocytopenia (HCC) 11/09/2016  . Anemia 11/09/2016  . Hypertension 11/09/2016  . 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 CKD (chronic kidney disease), stage III (HCC) 11/09/2016    Past Surgical History:  Procedure Laterality Date  . CHOLECYSTECTOMY       OB History   None      Home Medications    Prior to Admission medications   Medication Sig Start Date End Date Taking? Authorizing Provider  albuterol (PROVENTIL HFA;VENTOLIN HFA) 108 (90 Base) MCG/ACT inhaler Inhale 2 puffs into the lungs every 6 (six) hours as needed for wheezing or shortness of breath. 01/17/18   Bing Neighbors, FNP  allopurinol (ZYLOPRIM) 100 MG tablet Take 1 tablet (100 mg total) by mouth daily. Patient taking differently: Take 100 mg by mouth 2 (two) times daily.  11/13/16   Alison Murray, MD  amLODipine (NORVASC) 10 MG tablet Take 1 tablet (10 mg total) by mouth daily. 01/12/18   Bing Neighbors, FNP  aspirin 81 MG chewable tablet Chew 81 mg by mouth  daily.    [provider]  atorvastatin (LIPITOR) 40 MG tablet Take 1 tablet (40 mg total) by mouth daily. 01/12/18   Bing Neighbors, FNP  carvedilol (COREG) 25 MG tablet Take 2 tablets (50 mg total) by mouth 2 (two) times daily with a meal. 01/12/18 02/11/18  Bing Neighbors, FNP  cholecalciferol (VITAMIN D) 1000 units tablet Take 1,000 Units by mouth 2 (two) times daily.     [provider]  dicyclomine (BENTYL) 20 MG tablet Take 20 mg by mouth as needed for spasms (diarrhea).  07/25/17   [provider]  ferrous sulfate 325 (65 FE) MG EC tablet Take 325 mg by mouth daily with breakfast.    [provider]  furosemide (LASIX) 40 MG tablet Take 1 tablet (40 mg total) by mouth daily. 01/12/18   Bing Neighbors, FNP  hydrALAZINE (APRESOLINE) 50 MG tablet Take 1 tablet (50 mg total) by mouth 3 (three) times daily. 01/12/18 02/11/18  Bing Neighbors, FNP  isosorbide mononitrate (IMDUR) 30 MG 24 hr tablet Take 1 tablet (30 mg total) by mouth daily. 01/12/18 04/12/18  Bing Neighbors, FNP  liraglutide (VICTOZA) 18 MG/3ML SOPN Inject 0.3 mLs (1.8 mg total) into the skin every evening. 01/12/18   Bing Neighbors, FNP  metolazone (ZAROXOLYN) 2.5 MG tablet Take 2.5 mg by mouth daily. 10/11/17   [provider]  pantoprazole (PROTONIX) 40 MG  tablet Take 1 tablet (40 mg total) by mouth daily. 01/12/18   Bing Neighbors, FNP  potassium chloride SA (K-DUR,KLOR-CON) 20 MEQ tablet Take 1 tablet (20 mEq total) by mouth every Monday, Wednesday, and Friday. 01/13/18   Bing Neighbors, FNP    Family History Family History  Problem Relation Age of Onset  . Diabetes Mother   . Hypertension Mother   . Breast cancer Mother   . Diabetes Father   . Hypertension Father   . Pancreatic cancer Father   . Stroke Neg Hx     Social History Social History   Tobacco Use  . Smoking status: Never Smoker  . Smokeless tobacco: Never Used  Substance Use  Topics  . Alcohol use: No  . Drug use: No     Allergies   Patient has no known allergies.   Review of Systems Review of Systems  All other systems reviewed and are negative.    Physical Exam Updated Vital Signs BP (!) 198/108 (BP Location: Left Arm)   Pulse (!) 106   Temp (!) 97.4 F (36.3 C) (Oral)   Resp (!) 22   Wt 104.3 kg   SpO2 (!) 87% Comment: informed the nurse put pt on 2L   BMI 39.48 kg/m   Physical Exam  Constitutional: She is oriented to person, place, and time. She appears well-developed and well-nourished.  Non-toxic appearance. No distress.  HENT:  Head: Normocephalic and atraumatic.  Eyes: Pupils are equal, round, and reactive to light. Conjunctivae, EOM and lids are normal.  Neck: Normal range of motion. Neck supple. No tracheal deviation present. No thyroid mass present.  Cardiovascular: Regular rhythm and normal heart sounds. Tachycardia present. Exam reveals no gallop.  No murmur heard. Pulmonary/Chest: Effort normal. No stridor. No respiratory distress. She has decreased breath sounds in the right lower field and the left lower field. She has no wheezes. She has rhonchi in the right lower field and the left lower field. She has no rales.  Abdominal: Soft. Normal appearance and bowel sounds are normal. She exhibits no distension. There is no tenderness. There is no rebound and no CVA tenderness.  Musculoskeletal: Normal range of motion. She exhibits no edema or tenderness.  Lymphadenopathy:  3+ bilateral lower extremity pitting edema  Neurological: She is alert and oriented to person, place, and time. She has normal strength. No cranial nerve deficit or sensory deficit. GCS eye subscore is 4. GCS verbal subscore is 5. GCS motor subscore is 6.  Skin: Skin is warm and dry. No abrasion and no rash noted.  Psychiatric: She has a normal mood and affect. Her speech is normal and behavior is normal.  Nursing note and vitals reviewed.    ED Treatments /  Results  Labs (all labs ordered are listed, but only abnormal results are displayed) Labs Reviewed  CBC WITH DIFFERENTIAL/PLATELET  COMPREHENSIVE METABOLIC PANEL  BRAIN NATRIURETIC PEPTIDE  TROPONIN I    EKG EKG Interpretation  Date/Time:  Saturday February 18 2018 10:11:45 EST Ventricular Rate:  93 PR Interval:    QRS Duration: 106 QT Interval:  396 QTC Calculation: 493 R Axis:   70 Text Interpretation:  Sinus rhythm Borderline repolarization abnormality Borderline prolonged QT interval Baseline wander in lead(s) V3 Partial missing lead(s): V3 Confirmed by Lorre Nick ([id number]) on 02/18/2018 11:34:36 AM   Radiology No results found.  Procedures Procedures (including critical care time)  Medications Ordered in ED Medications - No data to display   Initial Impression /  Assessment and Plan / ED Course  I have reviewed the triage vital signs and the nursing notes.  Pertinent labs & imaging results that were available during my care of the patient were reviewed by me and considered in my medical decision making (see chart for details).     Patient has evidence of CHF on her chest x-ray.  EKG without ischemic changes.  Has had chronically elevated troponins in the past.  Given Lasix 60 mg IV push.  Placed on nitroglycerin paste.  Will be admitted for CHF exacerbation  CRITICAL CARE Performed by: Toy BakerAnthony T Kaysea Raya Total critical care time: 60 minutes Critical care time was exclusive of separately billable procedures and treating other patients. Critical care was necessary to treat or prevent imminent or life-threatening deterioration. Critical care was time spent personally by me on the following activities: development of treatment plan with patient and/or surrogate as well as nursing, discussions with consultants, evaluation of patient's response to treatment, examination of patient, obtaining history from patient or surrogate, ordering and performing treatments and  interventions, ordering and review of laboratory studies, ordering and review of radiographic studies, pulse oximetry and re-evaluation of patient's condition.   Final Clinical Impressions(s) / ED Diagnoses   Final diagnoses:  None    ED Discharge Orders    None       Lorre NickAllen, Adreena Willits, MD 02/18/18 1355

## 2018-02-18 NOTE — ED Notes (Signed)
Date and time results received: 02/18/18 1336 (use smartphrase ".now" to insert current time)  Test: Troponin Critical Value: 0.04  Name of Provider Notified: Dr. Freida BusmanAllen  Orders Received? Or Actions Taken?:

## 2018-02-18 NOTE — H&P (Signed)
History and Physical    [name] ZOX:[id number] DOB: 11/22/1960 DOA: 02/18/2018  PCP: Bing Neighbors, FNP   Patient coming from: Home  Chief Complaint: Dyspnea.   HPI: Barbara Velazquez is a 57 y.o. female with medical history significant of congestive heart failure, type 2 diabetes mellitus and hypertension.  Patient presents with worsening dyspnea for the last 9 days, severe in intensity, worse with exertion, no improving factors, associated with PND, orthopnea and lower extremity edema.  She recently moved from [place], she noticed worsening dyspnea in October 2019, she was seen by her primary care provider who prescribed furosemide, she continued to have persistent dyspnea that acutely worsened after Thanksgiving.  Apparently patient has not been following a salt restricted diet.  Denies any chest pain.  Had frequent hospitalizations for heart failure in the past.   ED Course: Patient was found volume overloaded, furosemide was prescribed and she was referred for admission for evaluation.  Review of Systems:  1. General: No fevers, no chills, no weight gain or weight loss 2. ENT: No runny nose or sore throat, no hearing disturbances 3. Pulmonary: Positive dyspnea, wheezing, but no cough or hemoptysis 4. Cardiovascular: No angina, claudication, positive lower extremity edema, pnd and orthopnea 5. Gastrointestinal: No nausea or vomiting, no diarrhea or constipation 6. Hematology: No easy bruisability or frequent infections 7. Urology: No dysuria, hematuria or increased urinary frequency 8. Dermatology: No rashes. 9. Neurology: No seizures or paresthesias 10. Musculoskeletal: No joint pain or deformities  Past Medical History:  Diagnosis Date  . CHF (congestive heart failure) (HCC)   . Diabetes mellitus without complication (HCC)   . Hypertension     Past Surgical History:  Procedure Laterality Date  . CHOLECYSTECTOMY       reports that she has never smoked. She has  never used smokeless tobacco. She reports that she does not drink alcohol or use drugs.  No Known Allergies  Family History  Problem Relation Age of Onset  . Diabetes Mother   . Hypertension Mother   . Breast cancer Mother   . Diabetes Father   . Hypertension Father   . Pancreatic cancer Father   . Stroke Neg Hx      Prior to Admission medications   Medication Sig Start Date End Date Taking? Authorizing Provider  albuterol (PROVENTIL HFA;VENTOLIN HFA) 108 (90 Base) MCG/ACT inhaler Inhale 2 puffs into the lungs every 6 (six) hours as needed for wheezing or shortness of breath. 01/17/18  Yes Bing Neighbors, FNP  amLODipine (NORVASC) 10 MG tablet Take 1 tablet (10 mg total) by mouth daily. 01/12/18  Yes Bing Neighbors, FNP  aspirin 81 MG chewable tablet Chew 81 mg by mouth daily.   Yes [provider]  atorvastatin (LIPITOR) 40 MG tablet Take 1 tablet (40 mg total) by mouth daily. 01/12/18  Yes Bing Neighbors, FNP  carvedilol (COREG) 25 MG tablet Take 2 tablets (50 mg total) by mouth 2 (two) times daily with a meal. 01/12/18 02/18/18 Yes Bing Neighbors, FNP  dicyclomine (BENTYL) 20 MG tablet Take 20 mg by mouth as needed for spasms (diarrhea).  07/25/17  Yes [provider]  furosemide (LASIX) 40 MG tablet Take 1 tablet (40 mg total) by mouth daily. 01/12/18  Yes Bing Neighbors, FNP  hydrALAZINE (APRESOLINE) 50 MG tablet Take 1 tablet (50 mg total) by mouth 3 (three) times daily. 01/12/18 02/18/18 Yes Bing Neighbors, FNP  isosorbide mononitrate (IMDUR) 30 MG 24 hr  tablet Take 1 tablet (30 mg total) by mouth daily. 01/12/18 04/12/18 Yes Bing NeighborsHarris, Kimberly S, FNP  levothyroxine (SYNTHROID, LEVOTHROID) 25 MCG tablet Take 25 mcg by mouth daily before breakfast.   Yes [provider]  liraglutide (VICTOZA) 18 MG/3ML SOPN Inject 0.3 mLs (1.8 mg total) into the skin every evening. 01/12/18  Yes Bing NeighborsHarris, Kimberly S, FNP  pantoprazole (PROTONIX) 40 MG  tablet Take 1 tablet (40 mg total) by mouth daily. 01/12/18  Yes Bing NeighborsHarris, Kimberly S, FNP  potassium chloride SA (K-DUR,KLOR-CON) 20 MEQ tablet Take 1 tablet (20 mEq total) by mouth every Monday, Wednesday, and Friday. 01/13/18  Yes Bing NeighborsHarris, Kimberly S, FNP  sodium bicarbonate 650 MG tablet Take 650 mg by mouth daily.   Yes [provider]    Physical Exam: Vitals:   02/18/18 1134 02/18/18 1233 02/18/18 1300 02/18/18 1330  BP: (!) 220/103 (!) 185/101 (!) 206/113 (!) 190/101  Pulse: 92 87 90 88  Resp: (!) 21 19 (!) 24 (!) 23  Temp:      TempSrc:      SpO2: 98% 100% 99% 99%  Weight:        Vitals:   02/18/18 1134 02/18/18 1233 02/18/18 1300 02/18/18 1330  BP: (!) 220/103 (!) 185/101 (!) 206/113 (!) 190/101  Pulse: 92 87 90 88  Resp: (!) 21 19 (!) 24 (!) 23  Temp:      TempSrc:      SpO2: 98% 100% 99% 99%  Weight:       General: deconditioned, ill looking appearing and dyspneic.  Neurology: Awake and alert, non focal Head and Neck. Head normocephalic. Neck supple with no adenopathy or thyromegaly.  Wide neck.  E ENT: positive pallor, no icterus, oral mucosa moist Cardiovascular: Moderted JVD/. S1-S2 present, rhythmic, no gallops, rubs, or murmurs. +++ pitting lower extremity edema. Pulmonary: decreased breath sounds bilaterally, decreased air movement, no wheezing, positive bilateral rhonchi and rales. Gastrointestinal. Abdomen protuberant no organomegaly, non tender, no rebound or guarding Skin. No rashes Musculoskeletal: no joint deformities    Labs on Admission: I have personally reviewed following labs and imaging studies  CBC: Recent Labs  Lab 02/18/18 1207  WBC 8.4  NEUTROABS 6.3  HGB 10.4*  HCT 34.0*  MCV 93.2  PLT 114*   Basic Metabolic Panel: Recent Labs  Lab 02/18/18 1207  NA 146*  K 4.1  CL 118*  CO2 21*  GLUCOSE 155*  BUN 36*  CREATININE 2.71*  CALCIUM 8.6*   GFR: Estimated Creatinine Clearance: 26.6 mL/min (A) (by C-G formula based on  SCr of 2.71 mg/dL (H)). Liver Function Tests: Recent Labs  Lab 02/18/18 1207  AST 27  ALT 23  ALKPHOS 88  BILITOT 0.6  PROT 6.1*  ALBUMIN 3.3*   No results for input(s): LIPASE, AMYLASE in the last 168 hours. No results for input(s): AMMONIA in the last 168 hours. Coagulation Profile: No results for input(s): INR, PROTIME in the last 168 hours. Cardiac Enzymes: Recent Labs  Lab 02/18/18 1207  TROPONINI 0.04*   BNP (last 3 results) No results for input(s): PROBNP in the last 8760 hours. HbA1C: No results for input(s): HGBA1C in the last 72 hours. CBG: No results for input(s): GLUCAP in the last 168 hours. Lipid Profile: No results for input(s): CHOL, HDL, LDLCALC, TRIG, CHOLHDL, LDLDIRECT in the last 72 hours. Thyroid Function Tests: No results for input(s): TSH, T4TOTAL, FREET4, T3FREE, THYROIDAB in the last 72 hours. Anemia Panel: No results for input(s): VITAMINB12, FOLATE, FERRITIN,  TIBC, IRON, RETICCTPCT in the last 72 hours. Urine analysis:    Component Value Date/Time   COLORURINE YELLOW 01/12/2018 2236   APPEARANCEUR CLEAR 01/12/2018 2236   LABSPEC 1.017 01/12/2018 2236   PHURINE 6.0 01/12/2018 2236   GLUCOSEU 150 (A) 01/12/2018 2236   HGBUR NEGATIVE 01/12/2018 2236   BILIRUBINUR NEGATIVE 01/12/2018 2236   KETONESUR NEGATIVE 01/12/2018 2236   PROTEINUR >=300 (A) 01/12/2018 2236   NITRITE NEGATIVE 01/12/2018 2236   LEUKOCYTESUR NEGATIVE 01/12/2018 2236    Radiological Exams on Admission: Dg Chest 2 View  Result Date: 02/18/2018 CLINICAL DATA:  Progressively worsening shortness of breath over the past month. EXAM: CHEST - 2 VIEW COMPARISON:  Chest x-ray dated December 21, 2017. FINDINGS: Stable mild cardiomegaly. Increased pulmonary vascular congestion and small bilateral pleural effusions. Left greater than right bibasilar atelectasis. No pneumothorax. No acute osseous abnormality. IMPRESSION: Worsening mild congestive heart failure. Electronically Signed    By: Obie Dredge M.D.   On: 02/18/2018 12:05    EKG: Independently reviewed.  Sinus rhythm, normal axis, normal intervals.  Assessment/Plan Active Problems:   CHF (congestive heart failure) (HCC)  57 year old female with history of diastolic heart failure who presents with worsening dyspnea for the last 9 days, more severe after Thanksgiving day.  Likely patient does not follow a salt restricted diet.  Her symptoms have been refractive to oral furosemide.  Continue to have PND, orthopnea and worsening lower extremity edema.  On her initial physical examination her blood pressure was 206/110, heart rate 91, respiratory 30, oxygen saturation 87% on room air.  Positive JVD, lungs with rales laterally, heart S1-S2 present, rhythmic, abdomen protuberant, +3+ pitting bilateral extremity edema.  Sodium 146, potassium 4.1, chloride 118, bicarb 21, glucose 155, BUN 36, creatinine 2.71, BNP 920, troponin 0.04, white count 8.4, hemoglobin 10.4, hematocrit 34.0, platelets 114, graph with vascular congestion and bilateral interstitial markings and bilateral effusions.  Patient will be admitted to the hospital with a working diagnosis of acute decompensation of chronic diastolic heart failure, complicated by acute kidney injury and acute hypoxic respiratory failure.  1.  Acute decompensated diastolic heart failure, complicated by acute pulmonary edema and acute hypoxic respiratory failure.  Patient will be admitted to the medical ward, she will be placed on a remote telemetry monitor, continue aggressive diuresis with furosemide 60 mg intravenously twice daily, targeting negative fluid balance.  Strict ins and outs and daily weights.  Continue telemetry monitoring.  Continue carvedilol, hydralazine and isosorbide.  Echocardiogram from August 2019 with preserved LV systolic function.   2.  Acute kidney injury chronic kidney disease stage IV.  His creatinine about 2.2, consistent with a calculated GFR of 28.   Continue aggressive diuresis with furosemide, cardiorenal syndrome.  Follow-up kidney function in the morning, avoid hypotension and nephrotoxic agents.  3.  Uncontrolled hypertension.  Continue amlodipine, carvedilol, isosorbide and hydralazine. PRN IV hydralazine as needed.  Aggressive diuresis, for volume overload.  4.  Dyslipidemia.  Continue atorvastatin.  5.  Hypothyroidism. Conrinue levothyroxine.  DVT prophylaxis: enoxaparin  Code Status: full  Family Communication: no family at the bedside   Disposition Plan: telemetry   Consults called: none   Admission status: inpatient.     Mauricio Annett Gula MD [facility] Pager [phone]  If 7PM-7AM, please contact night-coverage [email or web address] Password [email or web address]  02/18/2018, 2:06 PM

## 2018-02-18 NOTE — ED Triage Notes (Addendum)
Pt arrives POV with c/o shortness of breath. Pt reports she has had SOB for 1 month that has been getting progressively worse. Pt has hx of CHF. Pt denies use of O2 at home. Pt arrives with RA sat of 87%. Pt placed on 2 L o2. Periorbital edema noted. Pt reports taking lasix as prescribed

## 2018-02-18 NOTE — ED Notes (Signed)
Pt states that they normally need an US IV. Kathlene NovemberMike RN made aware of need for US IV.

## 2018-02-18 NOTE — ED Notes (Signed)
Bed: WA03 Expected date:  Expected time:  Means of arrival:  Comments: 

## 2018-02-19 DIAGNOSIS — E1122 Type 2 diabetes mellitus with diabetic chronic kidney disease: Secondary | ICD-10-CM

## 2018-02-19 DIAGNOSIS — N183 Chronic kidney disease, stage 3 (moderate): Secondary | ICD-10-CM

## 2018-02-19 DIAGNOSIS — Z794 Long term (current) use of insulin: Secondary | ICD-10-CM

## 2018-02-19 DIAGNOSIS — J9621 Acute and chronic respiratory failure with hypoxia: Secondary | ICD-10-CM

## 2018-02-19 DIAGNOSIS — J810 Acute pulmonary edema: Secondary | ICD-10-CM

## 2018-02-19 DIAGNOSIS — I5031 Acute diastolic (congestive) heart failure: Secondary | ICD-10-CM

## 2018-02-19 LAB — CBC
HCT: 26 % — ABNORMAL LOW (ref 36.0–46.0)
Hemoglobin: 8.4 g/dL — ABNORMAL LOW (ref 12.0–15.0)
MCH: 29.7 pg (ref 26.0–34.0)
MCHC: 32.3 g/dL (ref 30.0–36.0)
MCV: 91.9 fL (ref 80.0–100.0)
PLATELETS: 89 10*3/uL — AB (ref 150–400)
RBC: 2.83 MIL/uL — ABNORMAL LOW (ref 3.87–5.11)
RDW: 16 % — ABNORMAL HIGH (ref 11.5–15.5)
WBC: 6.4 10*3/uL (ref 4.0–10.5)
nRBC: 0 % (ref 0.0–0.2)

## 2018-02-19 LAB — GLUCOSE, CAPILLARY
Glucose-Capillary: 158 mg/dL — ABNORMAL HIGH (ref 70–99)
Glucose-Capillary: 134 mg/dL — ABNORMAL HIGH (ref 70–99)

## 2018-02-19 LAB — RETICULOCYTES
Immature Retic Fract: 18.8 % — ABNORMAL HIGH (ref 2.3–15.9)
RBC.: 3.09 MIL/uL — ABNORMAL LOW (ref 3.87–5.11)
Retic Count, Absolute: 88.4 10*3/uL (ref 19.0–186.0)
Retic Ct Pct: 2.9 % (ref 0.4–3.1)

## 2018-02-19 LAB — BASIC METABOLIC PANEL
ANION GAP: 6 (ref 5–15)
BUN: 38 mg/dL — ABNORMAL HIGH (ref 6–20)
CHLORIDE: 118 mmol/L — AB (ref 98–111)
CO2: 22 mmol/L (ref 22–32)
Calcium: 8.2 mg/dL — ABNORMAL LOW (ref 8.9–10.3)
Creatinine, Ser: 3.01 mg/dL — ABNORMAL HIGH (ref 0.44–1.00)
GFR calc Af Amer: 19 mL/min — ABNORMAL LOW (ref >60)
GFR calc non Af Amer: 16 mL/min — ABNORMAL LOW (ref >60)
Glucose, Bld: 152 mg/dL — ABNORMAL HIGH (ref 70–99)
POTASSIUM: 3.9 mmol/L (ref 3.5–5.1)
Sodium: 146 mmol/L — ABNORMAL HIGH (ref 135–145)

## 2018-02-19 LAB — FERRITIN: Ferritin: 86 ng/mL (ref 11–307)

## 2018-02-19 LAB — IRON AND TIBC
Iron: 30 ug/dL (ref 28–170)
Saturation Ratios: 13 % (ref 10.4–31.8)
TIBC: 240 ug/dL — ABNORMAL LOW (ref 250–450)
UIBC: 210 ug/dL

## 2018-02-19 LAB — VITAMIN B12: Vitamin B-12: 484 pg/mL (ref 180–914)

## 2018-02-19 LAB — FOLATE: FOLATE: 10.6 ng/mL (ref >5.9)

## 2018-02-19 MED ORDER — INSULIN ASPART 100 UNIT/ML ~~LOC~~ SOLN
0.0000 [IU] | Freq: Three times a day (TID) | SUBCUTANEOUS | Status: DC
  Administered 2018-02-19: 2 [IU] via SUBCUTANEOUS
  Administered 2018-02-20: 1 [IU] via SUBCUTANEOUS
  Administered 2018-02-20 – 2018-02-21 (×2): 2 [IU] via SUBCUTANEOUS
  Administered 2018-02-22 – 2018-02-23 (×4): 1 [IU] via SUBCUTANEOUS
  Administered 2018-02-23 – 2018-02-25 (×8): 2 [IU] via SUBCUTANEOUS
  Administered 2018-02-26 (×2): 1 [IU] via SUBCUTANEOUS
  Administered 2018-02-26: 3 [IU] via SUBCUTANEOUS
  Administered 2018-02-27: 1 [IU] via SUBCUTANEOUS
  Administered 2018-02-27 – 2018-02-28 (×3): 2 [IU] via SUBCUTANEOUS
  Administered 2018-02-28: 3 [IU] via SUBCUTANEOUS
  Administered 2018-03-01: 2 [IU] via SUBCUTANEOUS
  Administered 2018-03-01: 3 [IU] via SUBCUTANEOUS

## 2018-02-19 MED ORDER — FUROSEMIDE 10 MG/ML IJ SOLN
80.0000 mg | Freq: Two times a day (BID) | INTRAMUSCULAR | Status: DC
  Administered 2018-02-19 – 2018-02-20 (×2): 80 mg via INTRAVENOUS
  Filled 2018-02-19 (×2): qty 8

## 2018-02-19 MED ORDER — INSULIN ASPART 100 UNIT/ML ~~LOC~~ SOLN
3.0000 [IU] | Freq: Three times a day (TID) | SUBCUTANEOUS | Status: DC
  Administered 2018-02-19 – 2018-03-01 (×25): 3 [IU] via SUBCUTANEOUS

## 2018-02-19 MED ORDER — METOLAZONE 2.5 MG PO TABS
2.5000 mg | ORAL_TABLET | Freq: Once | ORAL | Status: AC
  Administered 2018-02-19: 2.5 mg via ORAL
  Filled 2018-02-19: qty 1

## 2018-02-19 MED ORDER — INSULIN ASPART 100 UNIT/ML ~~LOC~~ SOLN
0.0000 [IU] | Freq: Every day | SUBCUTANEOUS | Status: DC
  Administered 2018-02-28: 2 [IU] via SUBCUTANEOUS

## 2018-02-19 MED ORDER — SODIUM BICARBONATE 650 MG PO TABS
650.0000 mg | ORAL_TABLET | Freq: Every day | ORAL | Status: DC
  Administered 2018-02-19 – 2018-02-25 (×7): 650 mg via ORAL
  Filled 2018-02-19 (×7): qty 1

## 2018-02-19 MED ORDER — HYDRALAZINE HCL 50 MG PO TABS
100.0000 mg | ORAL_TABLET | Freq: Three times a day (TID) | ORAL | Status: DC
  Administered 2018-02-19 – 2018-02-26 (×22): 100 mg via ORAL
  Filled 2018-02-19 (×22): qty 2

## 2018-02-19 NOTE — Progress Notes (Signed)
[facility] PROGRESS NOTE    Progress Note  [name]  ZOX:[id number] DOB: Jul 02, 1960 DOA: 02/18/2018 PCP: Bing Neighbors, FNP     Brief Narrative:   Barbara Velazquez is an 57 y.o. female past medical history of congestive heart failure with an EF of 55% and grade 2 diastolic heart failure diabetes mellitus type 2 presents with worsening the dyspnea for the last 7 days prior to admission worsening with exertion, with PND and lower extremity edema  Assessment/Plan:   ACute respiratory failure with hypoxia due to acute decompensated diastolic heart failure in the setting of acute pulmonary edema: Her est. dry weight around 96-97 kg, on admission 104kg. BNP elevated, with a chest x-ray showing bilateral pulmonary infiltrates. Moderate diuresis, Increase IV Lasix.  Monitor and replete electrolytes as needed. increase Imdur and hydralazine DC Norvasc will try to vasodilate him as much as possible. Monitor strict I's and O's Daily weights.  Elevated cardiac biomarkers: Have remained flat likely demand ischemia in the setting of acute decompensated heart failure. Normal sinus rhythm normal axis nonspecific T wave changes..  Normocytic anemia: With borderline high MCV and elevated RDW, check an anemia panel.  Essential hypertension: Blood pressure significantly elevated increase her Imdur and hydralazine.  DM (diabetes mellitus), type 2 with renal complications (HCC) Fairly controlled, continue to monitor.  CKD (chronic kidney disease), stage III (HCC) Continue bicarbonate tablets baseline creatinine around 2.2-2.6 On admission 3 likely due to volume overload continue aggressive IV diuresis.   DVT prophylaxis: lovenox Family Communication:none Disposition Plan/Barrier to D/C: Once diuresed to dry weight Code Status:     Code Status Orders  (From admission, onward)         Start     Ordered   02/18/18 1615  Full code  Continuous     02/18/18 1614        Code  Status History    Date Active Date Inactive Code Status Order ID Comments User Context   11/03/2017 0259 11/05/2017 1733 Full Code [id number]  Lorretta Harp, MD ED   11/09/2016 0243 11/13/2016 1813 Full Code [id number]  Therisa Doyne, MD ED        IV Access:    Peripheral IV   Procedures and diagnostic studies:   Dg Chest 2 View  Result Date: 02/18/2018 CLINICAL DATA:  Progressively worsening shortness of breath over the past month. EXAM: CHEST - 2 VIEW COMPARISON:  Chest x-ray dated December 21, 2017. FINDINGS: Stable mild cardiomegaly. Increased pulmonary vascular congestion and small bilateral pleural effusions. Left greater than right bibasilar atelectasis. No pneumothorax. No acute osseous abnormality. IMPRESSION: Worsening mild congestive heart failure. Electronically Signed   By: Obie Dredge M.D.   On: 02/18/2018 12:05     Medical Consultants:    None.  Anti-Infectives:   None  Subjective:    Barbara Velazquez she relates her breathing is unchanged still with difficulty breathing requiring oxygen.  Objective:    Vitals:   02/18/18 2112 02/19/18 0455 02/19/18 0456 02/19/18 0705  BP: (!) 146/76  (!) 183/99 (!) 177/94  Pulse: 83  87 89  Resp: 18  18   Temp: 98.4 F (36.9 C)  98.5 F (36.9 C)   TempSrc: Oral  Oral   SpO2: 100%  97%   Weight:  104.2 kg    Height:        Intake/Output Summary (Last 24 hours) at 02/19/2018 5784 Last data filed at 02/19/2018 0600 Gross per 24 hour  Intake 260 ml  Output  1300 ml  Net -1040 ml   Filed Weights   02/18/18 1006 02/18/18 1831 02/19/18 0455  Weight: 104.3 kg 105.2 kg 104.2 kg    Exam: General exam: In no acute distress morbidly obese female Respiratory system: Good air movement and clear to auscultation. Cardiovascular system: S1 & S2 heard, RRR.  Positive JVD Gastrointestinal system: Abdomen is nondistended, soft and nontender.  Central nervous system: Alert and oriented. No focal neurological  deficits. Extremities: Trace lower extremity edema Skin: No rashes, lesions or ulcers Psychiatry: Judgement and insight appear normal. Mood & affect appropriate.    Data Reviewed:    Labs: Basic Metabolic Panel: Recent Labs  Lab 02/18/18 1207 02/19/18 0518  NA 146* 146*  K 4.1 3.9  CL 118* 118*  CO2 21* 22  GLUCOSE 155* 152*  BUN 36* 38*  CREATININE 2.71* 3.01*  CALCIUM 8.6* 8.2*   GFR Estimated Creatinine Clearance: 25.3 mL/min (A) (by C-G formula based on SCr of 3.01 mg/dL (H)). Liver Function Tests: Recent Labs  Lab 02/18/18 1207  AST 27  ALT 23  ALKPHOS 88  BILITOT 0.6  PROT 6.1*  ALBUMIN 3.3*   No results for input(s): LIPASE, AMYLASE in the last 168 hours. No results for input(s): AMMONIA in the last 168 hours. Coagulation profile No results for input(s): INR, PROTIME in the last 168 hours.  CBC: Recent Labs  Lab 02/18/18 1207 02/19/18 0518  WBC 8.4 6.4  NEUTROABS 6.3  --   HGB 10.4* 8.4*  HCT 34.0* 26.0*  MCV 93.2 91.9  PLT 114* 89*   Cardiac Enzymes: Recent Labs  Lab 02/18/18 1207  TROPONINI 0.04*   BNP (last 3 results) No results for input(s): PROBNP in the last 8760 hours. CBG: No results for input(s): GLUCAP in the last 168 hours. D-Dimer: No results for input(s): DDIMER in the last 72 hours. Hgb A1c: No results for input(s): HGBA1C in the last 72 hours. Lipid Profile: No results for input(s): CHOL, HDL, LDLCALC, TRIG, CHOLHDL, LDLDIRECT in the last 72 hours. Thyroid function studies: No results for input(s): TSH, T4TOTAL, T3FREE, THYROIDAB in the last 72 hours.  Invalid input(s): FREET3 Anemia work up: No results for input(s): VITAMINB12, FOLATE, FERRITIN, TIBC, IRON, RETICCTPCT in the last 72 hours. Sepsis Labs: Recent Labs  Lab 02/18/18 1207 02/19/18 0518  WBC 8.4 6.4   Microbiology No results found for this or any previous visit (from the past 240 hour(s)).   Medications:   . amLODipine  10 mg Oral Daily  .  aspirin  81 mg Oral Daily  . atorvastatin  40 mg Oral q1800  . carvedilol  50 mg Oral BID WC  . enoxaparin (LOVENOX) injection  40 mg Subcutaneous q1800  . furosemide  60 mg Intravenous Q12H  . hydrALAZINE  50 mg Oral TID  . Influenza vac split quadrivalent PF  0.5 mL Intramuscular Tomorrow-1000  . isosorbide mononitrate  30 mg Oral Daily  . levothyroxine  25 mcg Oral QAC breakfast  . pantoprazole  40 mg Oral Daily   Continuous Infusions:    LOS: 1 day   Marinda ElkAbraham Feliz Ortiz  [facility]   *Please refer to [email or web address], password [email or web address] to get updated schedule on who will round on this patient, as hospitalists switch teams weekly. If 7PM-7AM, please contact night-coverage at [email or web address], password [email or web address] for any overnight needs.  02/19/2018, 8:22 AM

## 2018-02-20 LAB — BASIC METABOLIC PANEL
Anion gap: 9 (ref 5–15)
BUN: 44 mg/dL — ABNORMAL HIGH (ref 6–20)
CO2: 23 mmol/L (ref 22–32)
Calcium: 8.4 mg/dL — ABNORMAL LOW (ref 8.9–10.3)
Chloride: 111 mmol/L (ref 98–111)
Creatinine, Ser: 3.68 mg/dL — ABNORMAL HIGH (ref 0.44–1.00)
GFR calc Af Amer: 15 mL/min — ABNORMAL LOW (ref >60)
GFR calc non Af Amer: 13 mL/min — ABNORMAL LOW (ref >60)
Glucose, Bld: 150 mg/dL — ABNORMAL HIGH (ref 70–99)
Potassium: 3.7 mmol/L (ref 3.5–5.1)
Sodium: 143 mmol/L (ref 135–145)

## 2018-02-20 LAB — GLUCOSE, CAPILLARY
Glucose-Capillary: 150 mg/dL — ABNORMAL HIGH (ref 70–99)
Glucose-Capillary: 184 mg/dL — ABNORMAL HIGH (ref 70–99)
Glucose-Capillary: 118 mg/dL — ABNORMAL HIGH (ref 70–99)
Glucose-Capillary: 122 mg/dL — ABNORMAL HIGH (ref 70–99)

## 2018-02-20 LAB — HIV ANTIBODY (ROUTINE TESTING W REFLEX): HIV Screen 4th Generation wRfx: NONREACTIVE

## 2018-02-20 MED ORDER — ISOSORBIDE MONONITRATE ER 60 MG PO TB24
120.0000 mg | ORAL_TABLET | Freq: Every day | ORAL | Status: DC
  Administered 2018-02-20 – 2018-02-25 (×6): 120 mg via ORAL
  Filled 2018-02-20 (×6): qty 2

## 2018-02-20 MED ORDER — HEPARIN SODIUM (PORCINE) 5000 UNIT/ML IJ SOLN
5000.0000 [IU] | Freq: Three times a day (TID) | INTRAMUSCULAR | Status: DC
  Administered 2018-02-20 – 2018-03-01 (×26): 5000 [IU] via SUBCUTANEOUS
  Filled 2018-02-20 (×26): qty 1

## 2018-02-20 MED ORDER — FUROSEMIDE 40 MG PO TABS
80.0000 mg | ORAL_TABLET | Freq: Two times a day (BID) | ORAL | Status: DC
  Administered 2018-02-20 – 2018-02-22 (×4): 80 mg via ORAL
  Filled 2018-02-20 (×4): qty 2

## 2018-02-20 MED ORDER — AMLODIPINE BESYLATE 10 MG PO TABS
10.0000 mg | ORAL_TABLET | Freq: Every day | ORAL | Status: DC
  Administered 2018-02-20 – 2018-02-26 (×7): 10 mg via ORAL
  Filled 2018-02-20 (×7): qty 1

## 2018-02-20 MED ORDER — FUROSEMIDE 10 MG/ML IJ SOLN
80.0000 mg | Freq: Two times a day (BID) | INTRAMUSCULAR | Status: DC
  Administered 2018-02-20: 80 mg via INTRAVENOUS
  Filled 2018-02-20: qty 8

## 2018-02-20 MED ORDER — FUROSEMIDE 10 MG/ML IJ SOLN: 80.0000 mg | Freq: Three times a day (TID) | INTRAMUSCULAR | Status: DC

## 2018-02-20 NOTE — Progress Notes (Signed)
Standing weight 102.69kg Barbara Velazquez, [name], RN

## 2018-02-20 NOTE — Progress Notes (Addendum)
[facility] PROGRESS NOTE    Progress Note  Barbara Velazquez  ZOX:[id number] DOB: 1960/08/16 DOA: 02/18/2018 PCP: Bing NeighborsHarris, Kimberly S, FNP     Brief Narrative:   Barbara Velazquez is an 57 y.o. female past medical history of congestive heart failure with an EF of 55% and grade 2 diastolic heart failure diabetes mellitus type 2 presents with worsening the dyspnea for the last 7 days prior to admission worsening with exertion, with PND and lower extremity edema  Assessment/Plan:   ACute respiratory failure with hypoxia due to acute decompensated diastolic heart failure in the setting of acute pulmonary edema: Her est. dry weight around 96-97 kg, on admission 104kg.  Today 108 kg which is unlikely Replete electrolytes as needed. Continue to titrate hydralazine and Imdur start oral Norvasc blood pressure is hard to control. Monitor strict I's and O's Daily weights. I's and O's poorly. She seems to be euvolemic on physical exam. We will change her to oral Lasix.  Elevated cardiac biomarkers: Have remained flat likely demand ischemia in the setting of acute decompensated heart failure. Normal sinus rhythm normal axis nonspecific T wave changes..  Normocytic anemia: With borderline high MCV and elevated RDW, check an anemia panel.  Essential hypertension: Blood pressure is hard to control, significantly increased. Continue Lasix titrate Imdur and hydralazine.  We will start her on her home dose of Norvasc.  DM (diabetes mellitus), type 2 with renal complications (HCC) Fairly controlled, continue to monitor.  CKD (chronic kidney disease), stage IV (HCC) Continue bicarbonate, she just [place] there is no baseline creatinine in her chart, probably her new baseline is been around 3.3-3.6. She will have to establish care with [facility] as an outpatient.   DVT prophylaxis: lovenox Family Communication:none Disposition Plan/Barrier to D/C: Once diuresed to dry  weight Code Status:     Code Status Orders  (From admission, onward)         Start     Ordered   02/18/18 1615  Full code  Continuous     02/18/18 1614        Code Status History    Date Active Date Inactive Code Status Order ID Comments User Context   11/03/2017 0259 11/05/2017 1733 Full Code [id number]  Lorretta HarpNiu, Xilin, MD ED   11/09/2016 0243 11/13/2016 1813 Full Code [id number]  Therisa Doyneoutova, Anastassia, MD ED        IV Access:    Peripheral IV   Procedures and diagnostic studies:   Dg Chest 2 View  Result Date: 02/18/2018 CLINICAL DATA:  Progressively worsening shortness of breath over the past month. EXAM: CHEST - 2 VIEW COMPARISON:  Chest x-ray dated December 21, 2017. FINDINGS: Stable mild cardiomegaly. Increased pulmonary vascular congestion and small bilateral pleural effusions. Left greater than right bibasilar atelectasis. No pneumothorax. No acute osseous abnormality. IMPRESSION: Worsening mild congestive heart failure. Electronically Signed   By: Obie DredgeWilliam T Derry M.D.   On: 02/18/2018 12:05     Medical Consultants:    None.  Anti-Infectives:   None  Subjective:    Barbara Velazquez she relates her breathing is unchanged still with difficulty breathing requiring oxygen.  Objective:    Vitals:   02/19/18 1321 02/19/18 1715 02/19/18 2127 02/20/18 0435  BP: (!) 149/81 (!) 156/85 (!) 159/85 (!) 173/89  Pulse: 85  79 77  Resp: 18  18 18   Temp: 97.9 F (36.6 C)  98.1 F (36.7 C) 97.9 F (36.6 C)  TempSrc: Oral  Oral Oral  SpO2: 96%  99% 100%  Weight:    108.6 kg  Height:        Intake/Output Summary (Last 24 hours) at 02/20/2018 0754 Last data filed at 02/19/2018 1837 Gross per 24 hour  Intake -  Output 600 ml  Net -600 ml   Filed Weights   02/18/18 1831 02/19/18 0455 02/20/18 0435  Weight: 105.2 kg 104.2 kg 108.6 kg    Exam: General exam: In no acute distress morbidly obese female Respiratory system: Good air movement and clear to  auscultation. Cardiovascular system: S1 & S2 heard, RRR.  Positive JVD Gastrointestinal system: Abdomen is nondistended, soft and nontender.  Central nervous system: Alert and oriented. No focal neurological deficits. Extremities: Trace lower extremity edema Skin: No rashes, lesions or ulcers Psychiatry: Judgement and insight appear normal. Mood & affect appropriate.    Data Reviewed:    Labs: Basic Metabolic Panel: Recent Labs  Lab 02/18/18 1207 02/19/18 0518  NA 146* 146*  K 4.1 3.9  CL 118* 118*  CO2 21* 22  GLUCOSE 155* 152*  BUN 36* 38*  CREATININE 2.71* 3.01*  CALCIUM 8.6* 8.2*   GFR Estimated Creatinine Clearance: 25.9 mL/min (A) (by C-G formula based on SCr of 3.01 mg/dL (H)). Liver Function Tests: Recent Labs  Lab 02/18/18 1207  AST 27  ALT 23  ALKPHOS 88  BILITOT 0.6  PROT 6.1*  ALBUMIN 3.3*   No results for input(s): LIPASE, AMYLASE in the last 168 hours. No results for input(s): AMMONIA in the last 168 hours. Coagulation profile No results for input(s): INR, PROTIME in the last 168 hours.  CBC: Recent Labs  Lab 02/18/18 1207 02/19/18 0518  WBC 8.4 6.4  NEUTROABS 6.3  --   HGB 10.4* 8.4*  HCT 34.0* 26.0*  MCV 93.2 91.9  PLT 114* 89*   Cardiac Enzymes: Recent Labs  Lab 02/18/18 1207  TROPONINI 0.04*   BNP (last 3 results) No results for input(s): PROBNP in the last 8760 hours. CBG: Recent Labs  Lab 02/19/18 1636 02/19/18 2128  GLUCAP 158* 134*   D-Dimer: No results for input(s): DDIMER in the last 72 hours. Hgb A1c: No results for input(s): HGBA1C in the last 72 hours. Lipid Profile: No results for input(s): CHOL, HDL, LDLCALC, TRIG, CHOLHDL, LDLDIRECT in the last 72 hours. Thyroid function studies: No results for input(s): TSH, T4TOTAL, T3FREE, THYROIDAB in the last 72 hours.  Invalid input(s): FREET3 Anemia work up: Recent Labs    02/19/18 0915  VITAMINB12 484  FOLATE 10.6  FERRITIN 86  TIBC 240*  IRON 30   RETICCTPCT 2.9   Sepsis Labs: Recent Labs  Lab 02/18/18 1207 02/19/18 0518  WBC 8.4 6.4   Microbiology No results found for this or any previous visit (from the past 240 hour(s)).   Medications:   . aspirin  81 mg Oral Daily  . atorvastatin  40 mg Oral q1800  . carvedilol  50 mg Oral BID WC  . enoxaparin (LOVENOX) injection  40 mg Subcutaneous q1800  . furosemide  80 mg Intravenous Q12H  . hydrALAZINE  100 mg Oral TID  . insulin aspart  0-5 Units Subcutaneous QHS  . insulin aspart  0-9 Units Subcutaneous TID WC  . insulin aspart  3 Units Subcutaneous TID WC  . isosorbide mononitrate  30 mg Oral Daily  . levothyroxine  25 mcg Oral QAC breakfast  . pantoprazole  40 mg Oral Daily  . sodium bicarbonate  650 mg Oral Daily  Continuous Infusions:    LOS: 2 days   Marinda Elk  [facility]   *Please refer to [email or web address], password [email or web address] to get updated schedule on who will round on this patient, as hospitalists switch teams weekly. If 7PM-7AM, please contact night-coverage at [email or web address], password [email or web address] for any overnight needs.  02/20/2018, 7:54 AM

## 2018-02-20 NOTE — Progress Notes (Signed)
Pharmacy Brief Note:  SCr = 3.68, CrCl ~20 mL/min.  Will change DVT prophylaxis from enoxaparin 40 mg subcutaneously daily to heparin 5000 units subQ q8h given renal function. Per protocol.  Cindi CarbonMary M Jaking Thayer, PharmD 02/20/18 3:25 PM

## 2018-02-21 ENCOUNTER — Encounter (HOSPITAL_COMMUNITY): Payer: Self-pay | Admitting: Nephrology

## 2018-02-21 LAB — BASIC METABOLIC PANEL
Anion gap: 7 (ref 5–15)
BUN: 51 mg/dL — ABNORMAL HIGH (ref 6–20)
CO2: 25 mmol/L (ref 22–32)
Calcium: 8.4 mg/dL — ABNORMAL LOW (ref 8.9–10.3)
Chloride: 109 mmol/L (ref 98–111)
Creatinine, Ser: 3.68 mg/dL — ABNORMAL HIGH (ref 0.44–1.00)
GFR calc Af Amer: 15 mL/min — ABNORMAL LOW (ref >60)
GFR calc non Af Amer: 13 mL/min — ABNORMAL LOW (ref >60)
GLUCOSE: 139 mg/dL — AB (ref 70–99)
Potassium: 3.5 mmol/L (ref 3.5–5.1)
Sodium: 141 mmol/L (ref 135–145)

## 2018-02-21 LAB — GLUCOSE, CAPILLARY
Glucose-Capillary: 127 mg/dL — ABNORMAL HIGH (ref 70–99)
Glucose-Capillary: 162 mg/dL — ABNORMAL HIGH (ref 70–99)
Glucose-Capillary: 122 mg/dL — ABNORMAL HIGH (ref 70–99)
Glucose-Capillary: 176 mg/dL — ABNORMAL HIGH (ref 70–99)

## 2018-02-21 MED ORDER — CLONIDINE HCL 0.3 MG PO TABS
0.3000 mg | ORAL_TABLET | Freq: Two times a day (BID) | ORAL | Status: DC
  Administered 2018-02-21 – 2018-02-24 (×7): 0.3 mg via ORAL
  Filled 2018-02-21 (×7): qty 1

## 2018-02-21 NOTE — Hospital Discharge Follow-Up (Signed)
Met with the patient to discuss follow up care after discharge. She is known to Primary Care at [facility].  An appointment has been scheduled for 02/28/18 @ 1350 and the information has been placed on the AVS.  She explained that she simply forgot about her appointment that had been scheduled for 02/08/2018.   She said that she is currently living with her son and his girlfriend but is hoping to soon find an apartment of her own.  She receives $1368/month disability, no food stamps.  She has applied for medicaid and said that she still needs to provide DSS with the title and registration for her car. She explained that she had full medicaid including transportation to medical appointments when she was living in [place] [facility].  She said there her son or his girlfriend may be able to take her to her appointment at the [facility].  She also noted that she takes Uber/Lyft if needed. Explained to her that the clinic can provide transportation to her appointment on 02/28/18 if needed. She has no equipment at home.  She does not have a scale or glucometer and explained that she has difficulty seeing the numbers on the devices.  She has her medications filled at [facility] and said that she has not had difficulty affording her medications.

## 2018-02-21 NOTE — Consult Note (Addendum)
Renal Service Consult Note [facility]  Barbara Velazquez 02/21/2018 Barbara Velazquez Requesting Physician:  Dr David Stall  Reason for Consult:  CKD IV HPI: The patient is a 57 y.o. year-old with hx of DM2 since 1998, HTN hard to control and CKD admitted for a/c diastolic CHF exacerbation.  Asked to see for CKD.    Pt admitted on 12/7, has diuresed net negative 5 L since admission and breathing is better.  Creat was 2.68 on admission and ^'d up to 3.68 today.  Recent creat on 01/06/18 was 2.88, before that on 12/21/17 creat was 2.20.  Baseline creat appears to be 2- 2.5 this year.  In 2018 baseline creat was better around 1.7- 2.1.    Patient moved from [facility] to [place] recently, planning on staying.  Has seen kidney doctors before but not in [place].  They mentioned possibility of dialysis one time in the past.      ROS  denies CP  no joint pain   no HA  no blurry vision  no rash  no diarrhea  no nausea/ vomiting   Past Medical History  Past Medical History:  Diagnosis Date  . CHF (congestive heart failure) (HCC)   . Diabetes mellitus without complication (HCC)   . Hypertension    Past Surgical History  Past Surgical History:  Procedure Laterality Date  . CHOLECYSTECTOMY     Family History  Family History  Problem Relation Age of Onset  . Diabetes Mother   . Hypertension Mother   . Breast cancer Mother   . Diabetes Father   . Hypertension Father   . Pancreatic cancer Father   . Stroke Neg Hx    Social History  reports that she has never smoked. She has never used smokeless tobacco. She reports that she does not drink alcohol or use drugs. Allergies No Known Allergies Home medications Prior to Admission medications   Medication Sig Start Date End Date Taking? Authorizing Provider  albuterol (PROVENTIL HFA;VENTOLIN HFA) 108 (90 Base) MCG/ACT inhaler Inhale 2 puffs into the lungs every 6 (six) hours as needed for wheezing or shortness of breath. 01/17/18  Yes  Barbara Neighbors, FNP  amLODipine (NORVASC) 10 MG tablet Take 1 tablet (10 mg total) by mouth daily. 01/12/18  Yes Barbara Neighbors, FNP  aspirin 81 MG chewable tablet Chew 81 mg by mouth daily.   Yes [provider]  atorvastatin (LIPITOR) 40 MG tablet Take 1 tablet (40 mg total) by mouth daily. 01/12/18  Yes Barbara Neighbors, FNP  carvedilol (COREG) 25 MG tablet Take 2 tablets (50 mg total) by mouth 2 (two) times daily with a meal. 01/12/18 02/18/18 Yes Barbara Neighbors, FNP  dicyclomine (BENTYL) 20 MG tablet Take 20 mg by mouth as needed for spasms (diarrhea).  07/25/17  Yes [provider]  furosemide (LASIX) 40 MG tablet Take 1 tablet (40 mg total) by mouth daily. 01/12/18  Yes Barbara Neighbors, FNP  hydrALAZINE (APRESOLINE) 50 MG tablet Take 1 tablet (50 mg total) by mouth 3 (three) times daily. 01/12/18 02/18/18 Yes Barbara Neighbors, FNP  isosorbide mononitrate (IMDUR) 30 MG 24 hr tablet Take 1 tablet (30 mg total) by mouth daily. 01/12/18 04/12/18 Yes Barbara Neighbors, FNP  levothyroxine (SYNTHROID, LEVOTHROID) 25 MCG tablet Take 25 mcg by mouth daily before breakfast.   Yes [provider]  liraglutide (VICTOZA) 18 MG/3ML SOPN Inject 0.3 mLs (1.8 mg total) into the skin every evening. 01/12/18  Yes Barbara NeighborsHarris, Barbara S, FNP  pantoprazole (PROTONIX) 40 MG tablet Take 1 tablet (40 mg total) by mouth daily. 01/12/18  Yes Barbara NeighborsHarris, Barbara S, FNP  potassium chloride SA (K-DUR,KLOR-CON) 20 MEQ tablet Take 1 tablet (20 mEq total) by mouth every Monday, Wednesday, and Friday. 01/13/18  Yes Barbara NeighborsHarris, Barbara S, FNP  sodium bicarbonate 650 MG tablet Take 650 mg by mouth daily.   Yes [provider]   Liver Function Tests Recent Labs  Lab 02/18/18 1207  AST 27  ALT 23  ALKPHOS 88  BILITOT 0.6  PROT 6.1*  ALBUMIN 3.3*   No results for input(s): LIPASE, AMYLASE in the last 168 hours. CBC Recent Labs  Lab 02/18/18 1207 02/19/18 0518  WBC 8.4 6.4   NEUTROABS 6.3  --   HGB 10.4* 8.4*  HCT 34.0* 26.0*  MCV 93.2 91.9  PLT 114* 89*   Basic Metabolic Panel Recent Labs  Lab 02/18/18 1207 02/19/18 0518 02/20/18 0836 02/21/18 0450  NA 146* 146* 143 141  K 4.1 3.9 3.7 3.5  CL 118* 118* 111 109  CO2 21* 22 23 25   GLUCOSE 155* 152* 150* 139*  BUN 36* 38* 44* 51*  CREATININE 2.71* 3.01* 3.68* 3.68*  CALCIUM 8.6* 8.2* 8.4* 8.4*   Iron/TIBC/Ferritin/ %Sat    Component Value Date/Time   IRON 30 02/19/2018 0915   TIBC 240 (L) 02/19/2018 0915   FERRITIN 86 02/19/2018 0915   IRONPCTSAT 13 02/19/2018 0915    Vitals:   02/21/18 0454 02/21/18 0459 02/21/18 0852 02/21/18 1237  BP: (!) 152/79 (!) 161/86 (!) 172/88 125/62  Pulse: 84 84  75  Resp: 20 20  17   Temp: 98.4 F (36.9 C) 98 F (36.7 C)  98.2 F (36.8 C)  TempSrc: Oral Oral  Oral  SpO2: 95% 94%  98%  Weight:  103 kg    Height:       Exam Gen alert, facial edema, no distress, calm No rash, cyanosis or gangrene Sclera anicteric, throat clear  No jvd or bruits Chest clear bilat no rales or wheezing RRR no MRG Abd soft ntnd no mass or ascites +bs GU defer MS no joint effusions or deformity Ext 1+ dependent hip and pretib edema, no wounds or ulcers Neuro is alert, Ox 3 , nf    Home meds:  - amlodipine 10/ carvedilol 50 bid/ furosemide 40 qd/ hydralazine 50 tid  - liraglutide 1.8mg  sq hs  - atorvastatin 40/ aspirin 81/ isosorbide mononitrate 30 qd  - pantoprazole 40/ potassium chloride 20 mwf/ sodium bicarb qd/ levothyroxine 25 ug qd/ albuterol HFA prn   Impression/ Plan: 1. Acute on CRF - creat up at 3.6 with recent baseline around 2- 2.5.  Suspect CKD due to HTN and DM2. Renal function has been worsening over the last year and his gone from stage III to stage IV now.  Here pt has a/c diast CHF w/ mild pulm edema, improved w/ diuresis. Lasix was IV , now po, agree with plan.  Will need renal f/u after dc, we will arrange.  Will follow.   2. DM2 - x 20  yrs 3. HTN -difficult to control 4. HL     Barbara Moselleob Posey Petrik MD [facility] pager [phone]   02/21/2018, 2:58 PM

## 2018-02-21 NOTE — Progress Notes (Addendum)
[facility] PROGRESS NOTE    Progress Note  Barbara Velazquez  UEA:[id number] DOB: October 23, 1960 DOA: 02/18/2018 PCP: Bing NeighborsHarris, Kimberly S, FNP     Brief Narrative:   Barbara Velazquez is an 57 y.o. female past medical history of congestive heart failure with an EF of 55% and grade 2 diastolic heart failure diabetes mellitus type 2 presents with worsening the dyspnea for the last 7 days prior to admission worsening with exertion, with PND and lower extremity edema  Assessment/Plan:   ACute respiratory failure with hypoxia due to acute decompensated diastolic heart failure in the setting of acute pulmonary edema: Her est. dry weight around 96-97 kg back in October 2019 We have maxed out her Coreg, Norvasc, hydralazine and Imdur.  We will add clonidine. Continue hydralazine IV PRN. Monitor strict I's and O's Daily weights. Her weight is not improving and her creatinine rising despite diuresis. We will consult nephrology. She has not seen a physician or nephrologist in more than a year. So I am concerned that her baseline creatinine is higher than when she came in.  Elevated cardiac biomarkers: Have remained flat likely demand ischemia in the setting of acute decompensated heart failure. Normal sinus rhythm normal axis nonspecific T wave changes..  Normocytic anemia: With borderline high MCV and elevated RDW, check an anemia panel.  Essential hypertension: Blood pressure is hard to control, significantly increased. Continue max dose of Norvasc, Coreg, hydralazine, Imdur. We have added clonidine we will continue to monitor vitals.  DM (diabetes mellitus), type 2 with renal complications (HCC) Control and sliding scale insulin will continue current management.  CKD (chronic kidney disease), stage IV (HCC) Continue bicarbonate, she [place] there is no baseline creatinine in her chart, probably her new baseline is higher than what is in the computer. Consulted  nephrology as her creatinine continues to rise despite diuresis. She continues to be fluid overloaded on physical exam.   DVT prophylaxis: lovenox Family Communication:none Disposition Plan/Barrier to D/C: Once diuresed to dry weight Code Status:     Code Status Orders  (From admission, onward)         Start     Ordered   02/18/18 1615  Full code  Continuous     02/18/18 1614        Code Status History    Date Active Date Inactive Code Status Order ID Comments User Context   11/03/2017 0259 11/05/2017 1733 Full Code [id number]  Lorretta HarpNiu, Xilin, MD ED   11/09/2016 0243 11/13/2016 1813 Full Code [id number]  Therisa Doyneoutova, Anastassia, MD ED        IV Access:    Peripheral IV   Procedures and diagnostic studies:   No results found.   Medical Consultants:    None.  Anti-Infectives:   None  Subjective:    Barbara Velazquez she relates her breathing is better today.  Objective:    Vitals:   02/21/18 0449 02/21/18 0454 02/21/18 0459 02/21/18 0852  BP:  (!) 152/79 (!) 161/86 (!) 172/88  Pulse:  84 84   Resp:  20 20   Temp:  98.4 F (36.9 C) 98 F (36.7 C)   TempSrc:  Oral Oral   SpO2:  95% 94%   Weight: 103 kg  103 kg   Height:        Intake/Output Summary (Last 24 hours) at 02/21/2018 0925 Last data filed at 02/21/2018 0500 Gross per 24 hour  Intake 600 ml  Output 2200 ml  Net -1600 ml  Filed Weights   02/20/18 0955 02/21/18 0449 02/21/18 0459  Weight: 102.7 kg 103 kg 103 kg    Exam: General exam: In no acute distress morbidly obese female Respiratory system: Good air movement, with crackles in the right lung base. Cardiovascular system: S1 & S2 heard, RRR.  Positive JVD Gastrointestinal system: Abdomen is soft nontender nondistended Central nervous system: Alert and oriented x3 Extremities: Trace lower extremity edema Skin: No rashes, lesions or ulcers Psychiatry: 20 and insight appear normal.   Data Reviewed:    Labs: Basic Metabolic  Panel: Recent Labs  Lab 02/18/18 1207 02/19/18 0518 02/20/18 0836 02/21/18 0450  NA 146* 146* 143 141  K 4.1 3.9 3.7 3.5  CL 118* 118* 111 109  CO2 21* 22 23 25   GLUCOSE 155* 152* 150* 139*  BUN 36* 38* 44* 51*  CREATININE 2.71* 3.01* 3.68* 3.68*  CALCIUM 8.6* 8.2* 8.4* 8.4*   GFR Estimated Creatinine Clearance: 20.6 mL/min (A) (by C-G formula based on SCr of 3.68 mg/dL (H)). Liver Function Tests: Recent Labs  Lab 02/18/18 1207  AST 27  ALT 23  ALKPHOS 88  BILITOT 0.6  PROT 6.1*  ALBUMIN 3.3*   No results for input(s): LIPASE, AMYLASE in the last 168 hours. No results for input(s): AMMONIA in the last 168 hours. Coagulation profile No results for input(s): INR, PROTIME in the last 168 hours.  CBC: Recent Labs  Lab 02/18/18 1207 02/19/18 0518  WBC 8.4 6.4  NEUTROABS 6.3  --   HGB 10.4* 8.4*  HCT 34.0* 26.0*  MCV 93.2 91.9  PLT 114* 89*   Cardiac Enzymes: Recent Labs  Lab 02/18/18 1207  TROPONINI 0.04*   BNP (last 3 results) No results for input(s): PROBNP in the last 8760 hours. CBG: Recent Labs  Lab 02/20/18 0808 02/20/18 1110 02/20/18 1638 02/20/18 2036 02/21/18 0741  GLUCAP 150* 184* 118* 122* 127*   D-Dimer: No results for input(s): DDIMER in the last 72 hours. Hgb A1c: No results for input(s): HGBA1C in the last 72 hours. Lipid Profile: No results for input(s): CHOL, HDL, LDLCALC, TRIG, CHOLHDL, LDLDIRECT in the last 72 hours. Thyroid function studies: No results for input(s): TSH, T4TOTAL, T3FREE, THYROIDAB in the last 72 hours.  Invalid input(s): FREET3 Anemia work up: Recent Labs    02/19/18 0915  VITAMINB12 484  FOLATE 10.6  FERRITIN 86  TIBC 240*  IRON 30  RETICCTPCT 2.9   Sepsis Labs: Recent Labs  Lab 02/18/18 1207 02/19/18 0518  WBC 8.4 6.4   Microbiology No results found for this or any previous visit (from the past 240 hour(s)).   Medications:   . amLODipine  10 mg Oral Daily  . aspirin  81 mg Oral Daily   . atorvastatin  40 mg Oral q1800  . carvedilol  50 mg Oral BID WC  . furosemide  80 mg Oral BID  . heparin injection (subcutaneous)  5,000 Units Subcutaneous Q8H  . hydrALAZINE  100 mg Oral TID  . insulin aspart  0-5 Units Subcutaneous QHS  . insulin aspart  0-9 Units Subcutaneous TID WC  . insulin aspart  3 Units Subcutaneous TID WC  . isosorbide mononitrate  120 mg Oral Daily  . levothyroxine  25 mcg Oral QAC breakfast  . pantoprazole  40 mg Oral Daily  . sodium bicarbonate  650 mg Oral Daily   Continuous Infusions:    LOS: 3 days   Marinda Elk  [facility]   *Please refer to [email or web address],  password [email or web address] to get updated schedule on who will round on this patient, as hospitalists switch teams weekly. If 7PM-7AM, please contact night-coverage at [email or web address], password [email or web address] for any overnight needs.  02/21/2018, 9:25 AM

## 2018-02-22 ENCOUNTER — Inpatient Hospital Stay (HOSPITAL_COMMUNITY): Payer: Self-pay

## 2018-02-22 LAB — GLUCOSE, CAPILLARY
GLUCOSE-CAPILLARY: 125 mg/dL — AB (ref 70–99)
Glucose-Capillary: 147 mg/dL — ABNORMAL HIGH (ref 70–99)
Glucose-Capillary: 130 mg/dL — ABNORMAL HIGH (ref 70–99)
Glucose-Capillary: 143 mg/dL — ABNORMAL HIGH (ref 70–99)

## 2018-02-22 LAB — BASIC METABOLIC PANEL
ANION GAP: 11 (ref 5–15)
BUN: 57 mg/dL — ABNORMAL HIGH (ref 6–20)
CALCIUM: 8.3 mg/dL — AB (ref 8.9–10.3)
CO2: 23 mmol/L (ref 22–32)
Chloride: 108 mmol/L (ref 98–111)
Creatinine, Ser: 3.94 mg/dL — ABNORMAL HIGH (ref 0.44–1.00)
GFR calc Af Amer: 14 mL/min — ABNORMAL LOW (ref >60)
GFR, EST NON AFRICAN AMERICAN: 12 mL/min — AB (ref >60)
Glucose, Bld: 163 mg/dL — ABNORMAL HIGH (ref 70–99)
Potassium: 3.7 mmol/L (ref 3.5–5.1)
Sodium: 142 mmol/L (ref 135–145)

## 2018-02-22 MED ORDER — FUROSEMIDE 10 MG/ML IJ SOLN
120.0000 mg | Freq: Three times a day (TID) | INTRAVENOUS | Status: DC
  Administered 2018-02-22 – 2018-02-23 (×3): 120 mg via INTRAVENOUS
  Filled 2018-02-22 (×2): qty 10
  Filled 2018-02-22 (×2): qty 12

## 2018-02-22 MED ORDER — SODIUM CHLORIDE 0.9 % IV SOLN
INTRAVENOUS | Status: DC | PRN
  Administered 2018-02-22 – 2018-03-01 (×5): 250 mL via INTRAVENOUS

## 2018-02-22 NOTE — Progress Notes (Signed)
Took patient off oxygen, patient sating 94 to 96%-RA, encouraged patient to use incentive spirometer and to notify staff with any SOB. Will continue to assess patient.

## 2018-02-22 NOTE — Progress Notes (Signed)
Care assumed from previous RN and agree with assessment. Patient up in chair, room air, no complaints.

## 2018-02-22 NOTE — Progress Notes (Signed)
[facility] Progress Note  Subjective: no new c/o's, seen in room, no SOB or cough.    Vitals:   02/21/18 2031 02/21/18 2154 02/22/18 0540 02/22/18 0850  BP: 136/70 132/68 140/69 140/72  Pulse: 72  77   Resp: (!) 24  18   Temp: 98.7 F (37.1 C)  98.1 F (36.7 C)   TempSrc: Oral  Oral   SpO2: 95%  98%   Weight:   102.5 kg   Height:        Inpatient medications: . amLODipine  10 mg Oral Daily  . aspirin  81 mg Oral Daily  . atorvastatin  40 mg Oral q1800  . carvedilol  50 mg Oral BID WC  . cloNIDine  0.3 mg Oral BID  . furosemide  80 mg Oral BID  . heparin injection (subcutaneous)  5,000 Units Subcutaneous Q8H  . hydrALAZINE  100 mg Oral TID  . insulin aspart  0-5 Units Subcutaneous QHS  . insulin aspart  0-9 Units Subcutaneous TID WC  . insulin aspart  3 Units Subcutaneous TID WC  . isosorbide mononitrate  120 mg Oral Daily  . levothyroxine  25 mcg Oral QAC breakfast  . pantoprazole  40 mg Oral Daily  . sodium bicarbonate  650 mg Oral Daily    acetaminophen **OR** acetaminophen, albuterol, hydrALAZINE, ondansetron **OR** ondansetron (ZOFRAN) IV  Iron/TIBC/Ferritin/ %Sat    Component Value Date/Time   IRON 30 02/19/2018 0915   TIBC 240 (L) 02/19/2018 0915   FERRITIN 86 02/19/2018 0915   IRONPCTSAT 13 02/19/2018 0915    Exam: Gen alert, facial edema, no distress, calm No rash, cyanosis or gangrene Sclera anicteric, throat clear  No jvd or bruits Chest clear bilat no rales or wheezing RRR no MRG Abd soft ntnd no mass or ascites +bs GU defer MS no joint effusions or deformity Ext 1+ dependent hip and pretib edema, no wounds or ulcers Neuro is alert, Ox 3 , nf    Home meds:  - amlodipine 10/ carvedilol 50 bid/ furosemide 40 qd/ hydralazine 50 tid  - liraglutide 1.8mg  sq hs  - atorvastatin 40/ aspirin 81/ isosorbide mononitrate 30 qd  - pantoprazole 40/ potassium chloride 20 mwf/ sodium bicarb qd/ levothyroxine 25   Impression/  Plan: 1. Acute on CRF - creat up at 3.6 with recent baseline around 2- 2.5.  Suspect CKD due to HTN and DM2. Renal function has been worsening over the last year and his gone from stage III to stage IV now.  Here pt has a/c diast CHF w/ mild pulm edema. Not diuresing a whole lot, was 97kg when here in August, still edematous on exam and CXR still vasc congestion.  Will resume IV diuretics at higher dosing.  Will need local nephrologist upon dc (moved from [place]).    2. DM2 - x 20 yrs 3. HTN -difficult to control 4. HL    Barbara Velazquez Antanette Richwine MD [facility] pager [phone]   02/22/2018, 11:52 AM   Recent Labs  Lab 02/18/18 1207  02/21/18 0450 02/22/18 0507  NA 146*   < > 141 142  K 4.1   < > 3.5 3.7  CL 118*   < > 109 108  CO2 21*   < > 25 23  GLUCOSE 155*   < > 139* 163*  BUN 36*   < > 51* 57*  CREATININE 2.71*   < > 3.68* 3.94*  CALCIUM 8.6*   < > 8.4* 8.3*  ALBUMIN  3.3*  --   --   --    < > = values in this interval not displayed.   Recent Labs  Lab 02/18/18 1207  AST 27  ALT 23  ALKPHOS 88  BILITOT 0.6  PROT 6.1*   Recent Labs  Lab 02/18/18 1207 02/19/18 0518  WBC 8.4 6.4  NEUTROABS 6.3  --   HGB 10.4* 8.4*  HCT 34.0* 26.0*  MCV 93.2 91.9  PLT 114* 89*

## 2018-02-22 NOTE — Progress Notes (Signed)
PROGRESS NOTE  Barbara Velazquez [id number] DOB: [birth year] DOA: 02/18/2018 PCP: Bing NeighborsHarris, Kimberly S, FNP  HPI/Recap of past 24 hours:  Urine output 1.6 L last 24 hours, remains volume overloaded on exam, creatinine worsening She denies of chest pain  Assessment/Plan: Active Problems:   Hypertension   DM (diabetes mellitus), type 2 with renal complications (HCC)   CKD (chronic kidney disease), stage IV (HCC)   Acute on chronic respiratory failure with hypoxia (HCC)   Acute diastolic heart failure (HCC)   Acute pulmonary edema (HCC)   ACute respiratory failure with hypoxia due to acute decompensated diastolic heart failure in the setting of acute pulmonary edema: Her est. dry weight around 96-97 kg back in October 2019 Coreg, Norvasc, hydralazine and Imdur.  Iv lasix   Essential hypertension: Blood pressure is hard to control, significantly increased. Continue max dose of Norvasc, Coreg, hydralazine, Imdur She is started on clonidine   Chronic thrombocytopenia: plt fluctuating From liver congestion? No sign of bleeding monitor  AKI on CKDIII/IV -cr worsening  -nephrology consulted, will follow recommendations    Anemia of chronic disease: hgb stable at baseline around 9 No sign of bleeding  Insulin dependent dm2, controlled -a1c6.3 -swas on insulin glargine 25unit qhs and victoza qhs at home I donot see insulin on home med list, will verify with patient -currently on ssi  HLD; on statin  Obesity: Body mass index is 35.39 kg/m.   Code Status: full  Family Communication: patient   Disposition Plan: not ready to discharge   Consultants:  nephrology  Procedures:  none  Antibiotics:  none   Objective: BP 132/64 (BP Location: Left Arm)   Pulse 65   Temp 97.7 F (36.5 C) (Oral)   Resp 16   Ht 5\' 7"  (1.702 m)   Wt 102.5 kg   SpO2 96%   BMI 35.39 kg/m   Intake/Output Summary (Last 24 hours) at 02/22/2018 1909 Last data filed at  02/22/2018 1800 Gross per 24 hour  Intake 837.89 ml  Output 1550 ml  Net -712.11 ml   Filed Weights   02/21/18 0449 02/21/18 0459 02/22/18 0540  Weight: 103 kg 103 kg 102.5 kg    Exam: Patient is examined daily including today on 02/22/2018, exams remain the same as of yesterday except that has changed    General:  NAD  Cardiovascular: RRR  Respiratory: Diminished at bases  Abdomen: Soft/ND/NT, positive BS  Musculoskeletal: Bilateral lower extremity pitting edema  Neuro: alert, oriented   Data Reviewed: Basic Metabolic Panel: Recent Labs  Lab 02/18/18 1207 02/19/18 0518 02/20/18 0836 02/21/18 0450 02/22/18 0507  NA 146* 146* 143 141 142  K 4.1 3.9 3.7 3.5 3.7  CL 118* 118* 111 109 108  CO2 21* 22 23 25 23   GLUCOSE 155* 152* 150* 139* 163*  BUN 36* 38* 44* 51* 57*  CREATININE 2.71* 3.01* 3.68* 3.68* 3.94*  CALCIUM 8.6* 8.2* 8.4* 8.4* 8.3*   Liver Function Tests: Recent Labs  Lab 02/18/18 1207  AST 27  ALT 23  ALKPHOS 88  BILITOT 0.6  PROT 6.1*  ALBUMIN 3.3*   No results for input(s): LIPASE, AMYLASE in the last 168 hours. No results for input(s): AMMONIA in the last 168 hours. CBC: Recent Labs  Lab 02/18/18 1207 02/19/18 0518  WBC 8.4 6.4  NEUTROABS 6.3  --   HGB 10.4* 8.4*  HCT 34.0* 26.0*  MCV 93.2 91.9  PLT 114* 89*   Cardiac Enzymes:   Recent Labs  Lab  02/18/18 1207  TROPONINI 0.04*   BNP (last 3 results) Recent Labs    11/03/17 0035 12/21/17 1730 02/18/18 1207  BNP 447.4* 1,439.6* 920.0*    ProBNP (last 3 results) No results for input(s): PROBNP in the last 8760 hours.  CBG: Recent Labs  Lab 02/21/18 1704 02/21/18 2033 02/22/18 0709 02/22/18 1226 02/22/18 1630  GLUCAP 122* 176* 147* 130* 143*    No results found for this or any previous visit (from the past 240 hour(s)).   Studies: Dg Chest 2 View  Result Date: 02/22/2018 CLINICAL DATA:  Follow-up edema EXAM: CHEST - 2 VIEW COMPARISON:  02/18/2018 FINDINGS:  Cardiac shadow remains enlarged. Central vascular congestion is again identified. Small posterior effusions are noted slightly improved when compared with the prior study. No new focal infiltrate is seen. No bony abnormality is noted. IMPRESSION: Stable vascular congestion with small pleural effusions. Electronically Signed   By: Alcide Clever M.D.   On: 02/22/2018 11:46   US Renal  Result Date: 02/22/2018 CLINICAL DATA:  Elevated serum creatinine. History of diabetes, hypertension, and CHF. EXAM: RENAL / URINARY TRACT ULTRASOUND COMPLETE COMPARISON:  None. FINDINGS: Right Kidney: Renal measurements: 11.0 x 4.6 x 4.6 cm = volume: 125 mL. The renal cortical echotexture is increased and exceeds that of the liver. There is no focal mass nor hydronephrosis. Left Kidney: Renal measurements: 12.1 x 5.9 x 5.8 cm = volume: 217 mL. The cortical echotexture of the left kidney is increased similar to that on the right. There is no focal mass or hydronephrosis. Bladder: Appears normal for degree of bladder distention. Bilateral pleural effusions are observed. IMPRESSION: Increased renal cortical echotexture compatible with medical renal disease. No hydronephrosis. Bilateral pleural effusions are noted. Electronically Signed   By: David  [place] M.D.   On: 02/22/2018 11:57    Scheduled Meds: . amLODipine  10 mg Oral Daily  . aspirin  81 mg Oral Daily  . atorvastatin  40 mg Oral q1800  . carvedilol  50 mg Oral BID WC  . cloNIDine  0.3 mg Oral BID  . heparin injection (subcutaneous)  5,000 Units Subcutaneous Q8H  . hydrALAZINE  100 mg Oral TID  . insulin aspart  0-5 Units Subcutaneous QHS  . insulin aspart  0-9 Units Subcutaneous TID WC  . insulin aspart  3 Units Subcutaneous TID WC  . isosorbide mononitrate  120 mg Oral Daily  . levothyroxine  25 mcg Oral QAC breakfast  . pantoprazole  40 mg Oral Daily  . sodium bicarbonate  650 mg Oral Daily    Continuous Infusions: . sodium chloride Stopped (02/22/18  1442)  . furosemide 120 mg (02/22/18 1314)     Time spent: 25 mins I have personally reviewed and interpreted on  02/22/2018 daily labs, tele strips, imagings as discussed above under date review session and assessment and plans.  I reviewed all nursing notes, pharmacy notes, consultant notes,  vitals, pertinent old records  I have discussed plan of care as described above with RN , patient  on 02/22/2018   Albertine Grates MD, PhD  [facility] Pager [phone]. If 7PM-7AM, please contact night-coverage at [email or web address], password [facility] 02/22/2018, 7:09 PM  LOS: 4 days

## 2018-02-23 LAB — CBC WITH DIFFERENTIAL/PLATELET
Abs Immature Granulocytes: 0.07 10*3/uL (ref 0.00–0.07)
Basophils Absolute: 0 10*3/uL (ref 0.0–0.1)
Basophils Relative: 1 %
EOS PCT: 3 %
Eosinophils Absolute: 0.1 10*3/uL (ref 0.0–0.5)
HCT: 24.7 % — ABNORMAL LOW (ref 36.0–46.0)
Hemoglobin: 7.6 g/dL — ABNORMAL LOW (ref 12.0–15.0)
Immature Granulocytes: 2 %
LYMPHS PCT: 17 %
Lymphs Abs: 0.8 10*3/uL (ref 0.7–4.0)
MCH: 28.7 pg (ref 26.0–34.0)
MCHC: 30.8 g/dL (ref 30.0–36.0)
MCV: 93.2 fL (ref 80.0–100.0)
MONO ABS: 0.4 10*3/uL (ref 0.1–1.0)
Monocytes Relative: 10 %
NEUTROS ABS: 3.1 10*3/uL (ref 1.7–7.7)
Neutrophils Relative %: 67 %
Platelets: 106 10*3/uL — ABNORMAL LOW (ref 150–400)
RBC: 2.65 MIL/uL — ABNORMAL LOW (ref 3.87–5.11)
RDW: 15.5 % (ref 11.5–15.5)
WBC: 4.6 10*3/uL (ref 4.0–10.5)
nRBC: 0 % (ref 0.0–0.2)

## 2018-02-23 LAB — GLUCOSE, CAPILLARY
GLUCOSE-CAPILLARY: 148 mg/dL — AB (ref 70–99)
Glucose-Capillary: 193 mg/dL — ABNORMAL HIGH (ref 70–99)
Glucose-Capillary: 163 mg/dL — ABNORMAL HIGH (ref 70–99)
Glucose-Capillary: 151 mg/dL — ABNORMAL HIGH (ref 70–99)

## 2018-02-23 LAB — HEMOGLOBIN A1C
Hgb A1c MFr Bld: 6.6 % — ABNORMAL HIGH (ref 4.8–5.6)
Mean Plasma Glucose: 142.72 mg/dL

## 2018-02-23 LAB — BASIC METABOLIC PANEL
Anion gap: 9 (ref 5–15)
BUN: 61 mg/dL — ABNORMAL HIGH (ref 6–20)
CO2: 26 mmol/L (ref 22–32)
Calcium: 8.5 mg/dL — ABNORMAL LOW (ref 8.9–10.3)
Chloride: 107 mmol/L (ref 98–111)
Creatinine, Ser: 4.06 mg/dL — ABNORMAL HIGH (ref 0.44–1.00)
GFR calc Af Amer: 13 mL/min — ABNORMAL LOW (ref >60)
GFR calc non Af Amer: 11 mL/min — ABNORMAL LOW (ref >60)
Glucose, Bld: 150 mg/dL — ABNORMAL HIGH (ref 70–99)
Potassium: 3.4 mmol/L — ABNORMAL LOW (ref 3.5–5.1)
Sodium: 142 mmol/L (ref 135–145)

## 2018-02-23 MED ORDER — FUROSEMIDE 10 MG/ML IJ SOLN
160.0000 mg | Freq: Three times a day (TID) | INTRAVENOUS | Status: DC
  Administered 2018-02-23 – 2018-02-24 (×3): 160 mg via INTRAVENOUS
  Filled 2018-02-23: qty 16
  Filled 2018-02-23: qty 10
  Filled 2018-02-23: qty 2
  Filled 2018-02-23: qty 16

## 2018-02-23 MED ORDER — SENNOSIDES-DOCUSATE SODIUM 8.6-50 MG PO TABS
1.0000 | ORAL_TABLET | Freq: Two times a day (BID) | ORAL | Status: DC
  Administered 2018-02-23 – 2018-03-01 (×11): 1 via ORAL
  Filled 2018-02-23 (×12): qty 1

## 2018-02-23 MED ORDER — POTASSIUM CHLORIDE CRYS ER 20 MEQ PO TBCR
30.0000 meq | EXTENDED_RELEASE_TABLET | Freq: Three times a day (TID) | ORAL | Status: AC
  Administered 2018-02-23 (×2): 30 meq via ORAL
  Filled 2018-02-23 (×2): qty 1

## 2018-02-23 MED ORDER — POLYETHYLENE GLYCOL 3350 17 G PO PACK
17.0000 g | PACK | Freq: Every day | ORAL | Status: DC
  Administered 2018-02-23 – 2018-02-25 (×3): 17 g via ORAL
  Filled 2018-02-23 (×3): qty 1

## 2018-02-23 NOTE — Progress Notes (Signed)
[facility] Progress Note  Subjective: no new c/o's, on RA now and she says that her DOE has been improving now.  2L out on high dose IV lasix.    Vitals:   02/22/18 2059 02/23/18 0430 02/23/18 0557 02/23/18 0804  BP: 131/72 (!) 148/73  (!) 147/70  Pulse: 71 86  86  Resp: 20 18    Temp: (!) 97.4 F (36.3 C) 98.2 F (36.8 C)  99.2 F (37.3 C)  TempSrc: Oral Oral  Oral  SpO2: 97% 90%  100%  Weight:   102.7 kg   Height:        Inpatient medications: . amLODipine  10 mg Oral Daily  . aspirin  81 mg Oral Daily  . atorvastatin  40 mg Oral q1800  . carvedilol  50 mg Oral BID WC  . cloNIDine  0.3 mg Oral BID  . heparin injection (subcutaneous)  5,000 Units Subcutaneous Q8H  . hydrALAZINE  100 mg Oral TID  . insulin aspart  0-5 Units Subcutaneous QHS  . insulin aspart  0-9 Units Subcutaneous TID WC  . insulin aspart  3 Units Subcutaneous TID WC  . isosorbide mononitrate  120 mg Oral Daily  . levothyroxine  25 mcg Oral QAC breakfast  . pantoprazole  40 mg Oral Daily  . sodium bicarbonate  650 mg Oral Daily   . sodium chloride Stopped (02/22/18 1442)  . furosemide 120 mg (02/23/18 0506)   sodium chloride, acetaminophen **OR** acetaminophen, albuterol, hydrALAZINE, ondansetron **OR** ondansetron (ZOFRAN) IV  Iron/TIBC/Ferritin/ %Sat    Component Value Date/Time   IRON 30 02/19/2018 0915   TIBC 240 (L) 02/19/2018 0915   FERRITIN 86 02/19/2018 0915   IRONPCTSAT 13 02/19/2018 0915    Exam: Gen alert, facial edema improved today No rash, cyanosis or gangrene Sclera anicteric, throat clear  No jvd or bruits Chest clear bilat no rales or wheezing RRR no MRG Abd soft ntnd no mass or ascites +bs GU defer MS no joint effusions or deformity Ext mild-mod dependent hip and pretib edema, improving Neuro is alert, Ox 3 , nf    Home meds:  - amlodipine 10/ carvedilol 50 bid/ furosemide 40 qd/ hydralazine 50 tid  - liraglutide 1.8mg  sq hs  - atorvastatin 40/  aspirin 81/ isosorbide mononitrate 30 qd  - pantoprazole 40/ potassium chloride 20 mwf/ sodium bicarb qd/ levothyroxine 25   Impression/ Plan: 1. Acute on CRF - creat up at 3.6 with recent baseline around 2- 2.5.  Suspect CKD due to HTN and DM2. Renal function has been worsening over the last year and his gone from stage III to stage IV now.  Here pt has fluid overload due to diast HF and/or advanced CKD (they look the same) w/ pulm edema. Was 97kg when here in August.  Making progress now, down from 3L [place] to RA today, would cont high dose IV lasix another day or so, get OOB/ mobilize.  Will need local nephrologist at dc, we will arrange.    2. DM2 - x 20 yrs 3. HTN -difficult to control 4. HL    Vinson Moselleob Doyne Micke MD [facility] pager [phone]   02/23/2018, 8:13 AM   Recent Labs  Lab 02/18/18 1207  02/22/18 0507 02/23/18 0517  NA 146*   < > 142 142  K 4.1   < > 3.7 3.4*  CL 118*   < > 108 107  CO2 21*   < > 23 26  GLUCOSE 155*   < >  163* 150*  BUN 36*   < > 57* 61*  CREATININE 2.71*   < > 3.94* 4.06*  CALCIUM 8.6*   < > 8.3* 8.5*  ALBUMIN 3.3*  --   --   --    < > = values in this interval not displayed.   Recent Labs  Lab 02/18/18 1207  AST 27  ALT 23  ALKPHOS 88  BILITOT 0.6  PROT 6.1*   Recent Labs  Lab 02/18/18 1207 02/19/18 0518 02/23/18 0517  WBC 8.4 6.4 4.6  NEUTROABS 6.3  --  3.1  HGB 10.4* 8.4* 7.6*  HCT 34.0* 26.0* 24.7*  MCV 93.2 91.9 93.2  PLT 114* 89* 106*

## 2018-02-23 NOTE — Progress Notes (Signed)
PROGRESS NOTE  Barbara BeltKarene Velazquez ZOX:[id number] DOB: [birth year] DOA: 02/18/2018 PCP: Bing NeighborsHarris, Kimberly S, FNP  HPI/Recap of past 24 hours:  Urine output 2.2 L last 24 hours, remains volume overloaded on exam, creatinine continue to increase She denies of chest pain  Assessment/Plan: Active Problems:   Hypertension   DM (diabetes mellitus), type 2 with renal complications (HCC)   CKD (chronic kidney disease), stage IV (HCC)   Acute on chronic respiratory failure with hypoxia (HCC)   Acute diastolic heart failure (HCC)   Acute pulmonary edema (HCC)   ACute respiratory failure with hypoxia due to acute decompensated diastolic heart failure in the setting of acute pulmonary edema: Her est. dry weight around 96-97 kg back in October 2019 Coreg, Norvasc, hydralazine and Imdur.  Iv lasix  Per nephrology  Essential hypertension: Blood pressure is hard to control, significantly increased. Continue max dose of Norvasc, Coreg, hydralazine, Imdur She is started on clonidine   Chronic thrombocytopenia: plt fluctuating From liver congestion? No sign of bleeding monitor  AKI on CKDIII/IV -cr worsening  -nephrology consulted, will follow recommendations    Anemia of chronic disease: hgb stable at baseline around 9 No sign of bleeding  Insulin dependent dm2, controlled -a1c6.3 -swas on insulin glargine 25unit qhs and victoza qhs at home I donot see insulin on home med list, will verify with patient -currently on ssi  HLD; on statin  Obesity: Body mass index is 35.46 kg/m.   Code Status: full  Family Communication: patient   Disposition Plan: not ready to discharge   Consultants:  nephrology  Procedures:  none  Antibiotics:  none   Objective: BP (!) 118/59 (BP Location: Left Arm)   Pulse 77   Temp 98.3 F (36.8 C) (Oral)   Resp 18   Ht 5\' 7"  (1.702 m)   Wt 102.7 kg   SpO2 92%   BMI 35.46 kg/m   Intake/Output Summary (Last 24 hours) at 02/23/2018  1859 Last data filed at 02/23/2018 1717 Gross per 24 hour  Intake 1014.74 ml  Output 2750 ml  Net -1735.26 ml   Filed Weights   02/21/18 0459 02/22/18 0540 02/23/18 0557  Weight: 103 kg 102.5 kg 102.7 kg    Exam: Patient is examined daily including today on 02/23/2018, exams remain the same as of yesterday except that has changed    General:  NAD  Cardiovascular: RRR  Respiratory: Diminished at bases  Abdomen: Soft/ND/NT, positive BS  Musculoskeletal: Bilateral lower extremity pitting edema  Neuro: alert, oriented   Data Reviewed: Basic Metabolic Panel: Recent Labs  Lab 02/19/18 0518 02/20/18 0836 02/21/18 0450 02/22/18 0507 02/23/18 0517  NA 146* 143 141 142 142  K 3.9 3.7 3.5 3.7 3.4*  CL 118* 111 109 108 107  CO2 22 23 25 23 26   GLUCOSE 152* 150* 139* 163* 150*  BUN 38* 44* 51* 57* 61*  CREATININE 3.01* 3.68* 3.68* 3.94* 4.06*  CALCIUM 8.2* 8.4* 8.4* 8.3* 8.5*   Liver Function Tests: Recent Labs  Lab 02/18/18 1207  AST 27  ALT 23  ALKPHOS 88  BILITOT 0.6  PROT 6.1*  ALBUMIN 3.3*   No results for input(s): LIPASE, AMYLASE in the last 168 hours. No results for input(s): AMMONIA in the last 168 hours. CBC: Recent Labs  Lab 02/18/18 1207 02/19/18 0518 02/23/18 0517  WBC 8.4 6.4 4.6  NEUTROABS 6.3  --  3.1  HGB 10.4* 8.4* 7.6*  HCT 34.0* 26.0* 24.7*  MCV 93.2 91.9 93.2  PLT  114* 89* 106*   Cardiac Enzymes:   Recent Labs  Lab 02/18/18 1207  TROPONINI 0.04*   BNP (last 3 results) Recent Labs    11/03/17 0035 12/21/17 1730 02/18/18 1207  BNP 447.4* 1,439.6* 920.0*    ProBNP (last 3 results) No results for input(s): PROBNP in the last 8760 hours.  CBG: Recent Labs  Lab 02/22/18 1630 02/22/18 2056 02/23/18 0803 02/23/18 1316 02/23/18 1741  GLUCAP 143* 125* 148* 193* 163*    No results found for this or any previous visit (from the past 240 hour(s)).   Studies: No results found.  Scheduled Meds: . amLODipine  10 mg  Oral Daily  . aspirin  81 mg Oral Daily  . atorvastatin  40 mg Oral q1800  . carvedilol  50 mg Oral BID WC  . cloNIDine  0.3 mg Oral BID  . heparin injection (subcutaneous)  5,000 Units Subcutaneous Q8H  . hydrALAZINE  100 mg Oral TID  . insulin aspart  0-5 Units Subcutaneous QHS  . insulin aspart  0-9 Units Subcutaneous TID WC  . insulin aspart  3 Units Subcutaneous TID WC  . isosorbide mononitrate  120 mg Oral Daily  . levothyroxine  25 mcg Oral QAC breakfast  . pantoprazole  40 mg Oral Daily  . polyethylene glycol  17 g Oral Daily  . senna-docusate  1 tablet Oral BID  . sodium bicarbonate  650 mg Oral Daily    Continuous Infusions: . sodium chloride 250 mL (02/23/18 1345)  . furosemide 160 mg (02/23/18 1346)     Time spent: 25 mins I have personally reviewed and interpreted on  02/23/2018 daily labs, tele strips, imagings as discussed above under date review session and assessment and plans.  I reviewed all nursing notes, pharmacy notes, consultant notes,  vitals, pertinent old records  I have discussed plan of care as described above with RN , patient  on 02/23/2018   Albertine Grates MD, PhD  [facility] Pager [phone]. If 7PM-7AM, please contact night-coverage at [email or web address], password [facility] 02/23/2018, 6:59 PM  LOS: 5 days

## 2018-02-24 LAB — CBC WITH DIFFERENTIAL/PLATELET
Abs Immature Granulocytes: 0.08 10*3/uL — ABNORMAL HIGH (ref 0.00–0.07)
Basophils Absolute: 0 10*3/uL (ref 0.0–0.1)
Basophils Relative: 1 %
EOS PCT: 2 %
Eosinophils Absolute: 0.1 10*3/uL (ref 0.0–0.5)
HCT: 24.9 % — ABNORMAL LOW (ref 36.0–46.0)
Hemoglobin: 7.7 g/dL — ABNORMAL LOW (ref 12.0–15.0)
Immature Granulocytes: 2 %
Lymphocytes Relative: 14 %
Lymphs Abs: 0.7 10*3/uL (ref 0.7–4.0)
MCH: 29.2 pg (ref 26.0–34.0)
MCHC: 30.9 g/dL (ref 30.0–36.0)
MCV: 94.3 fL (ref 80.0–100.0)
Monocytes Absolute: 0.3 10*3/uL (ref 0.1–1.0)
Monocytes Relative: 7 %
Neutro Abs: 3.5 10*3/uL (ref 1.7–7.7)
Neutrophils Relative %: 74 %
Platelets: 116 10*3/uL — ABNORMAL LOW (ref 150–400)
RBC: 2.64 MIL/uL — ABNORMAL LOW (ref 3.87–5.11)
RDW: 15.6 % — ABNORMAL HIGH (ref 11.5–15.5)
WBC: 4.7 10*3/uL (ref 4.0–10.5)
nRBC: 0 % (ref 0.0–0.2)

## 2018-02-24 LAB — BASIC METABOLIC PANEL
Anion gap: 11 (ref 5–15)
BUN: 68 mg/dL — ABNORMAL HIGH (ref 6–20)
CO2: 25 mmol/L (ref 22–32)
Calcium: 8.4 mg/dL — ABNORMAL LOW (ref 8.9–10.3)
Chloride: 105 mmol/L (ref 98–111)
Creatinine, Ser: 4.25 mg/dL — ABNORMAL HIGH (ref 0.44–1.00)
GFR calc Af Amer: 13 mL/min — ABNORMAL LOW (ref >60)
GFR calc non Af Amer: 11 mL/min — ABNORMAL LOW (ref >60)
Glucose, Bld: 184 mg/dL — ABNORMAL HIGH (ref 70–99)
Potassium: 4.1 mmol/L (ref 3.5–5.1)
Sodium: 141 mmol/L (ref 135–145)

## 2018-02-24 LAB — PREPARE RBC (CROSSMATCH)

## 2018-02-24 LAB — GLUCOSE, CAPILLARY
GLUCOSE-CAPILLARY: 177 mg/dL — AB (ref 70–99)
GLUCOSE-CAPILLARY: 172 mg/dL — AB (ref 70–99)
Glucose-Capillary: 168 mg/dL — ABNORMAL HIGH (ref 70–99)
Glucose-Capillary: 165 mg/dL — ABNORMAL HIGH (ref 70–99)

## 2018-02-24 MED ORDER — BISACODYL 10 MG RE SUPP
10.0000 mg | Freq: Every day | RECTAL | Status: DC
  Administered 2018-02-25: 10 mg via RECTAL
  Filled 2018-02-24 (×3): qty 1

## 2018-02-24 MED ORDER — SODIUM CHLORIDE 0.9% IV SOLUTION: Freq: Once | INTRAVENOUS | Status: DC

## 2018-02-24 MED ORDER — FUROSEMIDE 10 MG/ML IJ SOLN
160.0000 mg | Freq: Four times a day (QID) | INTRAVENOUS | Status: DC
  Administered 2018-02-24 – 2018-03-01 (×20): 160 mg via INTRAVENOUS
  Filled 2018-02-24 (×3): qty 16
  Filled 2018-02-24: qty 4
  Filled 2018-02-24 (×2): qty 2
  Filled 2018-02-24: qty 16
  Filled 2018-02-24 (×2): qty 2
  Filled 2018-02-24 (×3): qty 16
  Filled 2018-02-24: qty 10
  Filled 2018-02-24: qty 16
  Filled 2018-02-24: qty 2
  Filled 2018-02-24: qty 10
  Filled 2018-02-24: qty 16
  Filled 2018-02-24: qty 2
  Filled 2018-02-24: qty 12
  Filled 2018-02-24: qty 14
  Filled 2018-02-24 (×2): qty 10

## 2018-02-24 MED ORDER — CARVEDILOL 25 MG PO TABS
25.0000 mg | ORAL_TABLET | Freq: Two times a day (BID) | ORAL | Status: DC
  Administered 2018-02-24 – 2018-02-27 (×6): 25 mg via ORAL
  Filled 2018-02-24 (×6): qty 1

## 2018-02-24 MED ORDER — METOLAZONE 5 MG PO TABS
5.0000 mg | ORAL_TABLET | Freq: Every day | ORAL | Status: DC
  Administered 2018-02-24 – 2018-02-25 (×2): 5 mg via ORAL
  Filled 2018-02-24 (×3): qty 1

## 2018-02-24 NOTE — Evaluation (Signed)
Physical Therapy Evaluation Patient Details Name: Barbara Velazquez MRN: [id number] DOB: Aug 29, 1960 Today's Date: 02/24/2018   History of Present Illness  Barbara Velazquez is a 57 y.o. year-old with hx of DM2 since 1998, HTN hard to control and CKD admitted for a/c diastolic CHF exacerbation and CKD.    Clinical Impression  Patient presents with decreased independence with mobility due to generalized weakness and limited cardiorespiratory endurance.  Patient with SpO2 80% after 100' ambulation on RA, 94% with ambualtion on 2LPM portable O2.  Feel she will benefit from skilled PT in the acute setting to allow return home with intermittent family support.     Follow Up Recommendations No PT follow up    Equipment Recommendations  Other (comment)(rollator walker)    Recommendations for Other Services       Precautions / Restrictions Precautions Precautions: Fall      Mobility  Bed Mobility               General bed mobility comments: patient up in chair  Transfers Overall transfer level: Needs assistance Equipment used: 4-wheeled walker Transfers: Sit to/from Stand Sit to Stand: Supervision         General transfer comment: cues for use of walker  Ambulation/Gait Ambulation/Gait assistance: Supervision;Min guard Gait Distance (Feet): 100 Feet(x 2 ) Assistive device: 4-wheeled walker Gait Pattern/deviations: Step-through pattern;Decreased stride length     General Gait Details: seated rest on rollator, initially on RA, SpO2 80%, placed on 2LPM portable O2 with SpO2 94% after walking back to room  Stairs            Wheelchair Mobility    Modified Rankin (Stroke Patients Only)       Balance Overall balance assessment: Mild deficits observed, not formally tested                                           Pertinent Vitals/Pain Pain Assessment: No/denies pain    Home Living Family/patient expects to be discharged to:: Private  residence Living Arrangements: Children Available Help at Discharge: Family Type of Home: House Home Access: Stairs to enter Entrance Stairs-Rails: Left;Right;Can reach both Secretary/administrator of Steps: 6 Home Layout: One level Home Equipment: None      Prior Function Level of Independence: Independent         Comments: son and his girlfriend work, sometimes at same time and pt has to care for 57 year old     Hand Dominance        Extremity/Trunk Assessment   Upper Extremity Assessment Upper Extremity Assessment: Defer to OT evaluation    Lower Extremity Assessment Lower Extremity Assessment: Generalized weakness    Cervical / Trunk Assessment Cervical / Trunk Assessment: Kyphotic  Communication   Communication: No difficulties  Cognition Arousal/Alertness: Awake/alert Behavior During Therapy: WFL for tasks assessed/performed Overall Cognitive Status: Within Functional Limits for tasks assessed                                        General Comments      Exercises     Assessment/Plan    PT Assessment Patient needs continued PT services  PT Problem List Decreased balance;Decreased strength;Decreased activity tolerance;Decreased knowledge of precautions;Decreased safety awareness;Decreased knowledge of use of DME  PT Treatment Interventions DME instruction;Functional mobility training;Gait training;Therapeutic activities;Stair training;Patient/family education    PT Goals (Current goals can be found in the Care Plan section)  Acute Rehab PT Goals Patient Stated Goal: to return to independent PT Goal Formulation: With patient Time For Goal Achievement: 03/03/18 Potential to Achieve Goals: Good    Frequency Min 3X/week   Barriers to discharge        Co-evaluation               AM-PAC PT "6 Clicks" Mobility  Outcome Measure Help needed turning from your back to your side while in a flat bed without using bedrails?:  None Help needed moving from lying on your back to sitting on the side of a flat bed without using bedrails?: A Little Help needed moving to and from a bed to a chair (including a wheelchair)?: A Little Help needed standing up from a chair using your arms (e.g., wheelchair or bedside chair)?: A Little Help needed to walk in hospital room?: A Little Help needed climbing 3-5 steps with a railing? : A Little 6 Click Score: 19    End of Session Equipment Utilized During Treatment: Gait belt;Oxygen Activity Tolerance: Treatment limited secondary to medical complications (Comment);Patient tolerated treatment well(desaturation on RA, felt better amb on O2) Patient left: with call bell/phone within reach;in chair Nurse Communication: Other (comment)(walking SpO2) PT Visit Diagnosis: Muscle weakness (generalized) (M62.81)    Time: 1634-1700 PT Time Calculation (min) (ACUTE ONLY): 26 min   Charges:   PT Evaluation $PT Eval Low Complexity: 1 Low PT Treatments $Gait Training: 8-22 mins        Sheran LawlessCyndi Alaisa Moffitt, [place] CarolinaPT Acute Rehabilitation Services [phone] 02/24/2018   Barbara Velazquez 02/24/2018, 5:57 PM

## 2018-02-24 NOTE — Progress Notes (Addendum)
PROGRESS NOTE  Barbara BeltKarene Velazquez ZOX:[id number] DOB: [birth year] DOA: 02/18/2018 PCP: Bing NeighborsHarris, Kimberly S, FNP  HPI/Recap of past 24 hours:  Urine output 2.2 L last 24 hours, remains volume overloaded on exam, creatinine continue to increase She denies of chest pain No fever Oxygen dropped to low 80s when she ambulated on room air  Assessment/Plan: Active Problems:   Hypertension   DM (diabetes mellitus), type 2 with renal complications (HCC)   CKD (chronic kidney disease), stage IV (HCC)   Acute on chronic respiratory failure with hypoxia (HCC)   Acute diastolic heart failure (HCC)   Acute pulmonary edema (HCC)   ACute respiratory failure with hypoxia due to acute decompensated diastolic heart failure in the setting of acute pulmonary edema: Her est. dry weight around 96-97 kg back in October 2019 Coreg, Norvasc, hydralazine and Imdur.  Iv lasix  Per nephrology  Essential hypertension: Blood pressure is hard to control, significantly increased. Continue max dose of Norvasc, Coreg, hydralazine, Imdur She is started on clonidine   Chronic thrombocytopenia: plt fluctuating From liver congestion? No sign of bleeding monitor  AKI on CKDIII/IV -cr worsening  -nephrology consulted, will follow recommendations    Anemia of chronic disease: hgb stable at baseline around 9, hgb dropped to 7.6 with significant dyspnea on exertion, will give one unit of blood No sign of bleeding  Insulin dependent dm2, controlled -a1c6.3 -swas on insulin glargine 25unit qhs and victoza qhs at home I donot see insulin on home med list, will verify with patient -currently on ssi  HLD; on statin  Obesity: Body mass index is 34.9 kg/m.   Code Status: full  Family Communication: patient   Disposition Plan: not ready to discharge   Consultants:  nephrology  Procedures:  none  Antibiotics:  none   Objective: BP 129/66 (BP Location: Right Arm)   Pulse 69   Temp (!) 97.5  F (36.4 C) (Oral)   Resp 18   Ht 5\' 7"  (1.702 m)   Wt 101.1 kg   SpO2 99%   BMI 34.90 kg/m   Intake/Output Summary (Last 24 hours) at 02/24/2018 1919 Last data filed at 02/24/2018 1533 Gross per 24 hour  Intake 506.61 ml  Output 1250 ml  Net -743.39 ml   Filed Weights   02/22/18 0540 02/23/18 0557 02/24/18 0414  Weight: 102.5 kg 102.7 kg 101.1 kg    Exam: Patient is examined daily including today on 02/24/2018, exams remain the same as of yesterday except that has changed    General:  NAD  Cardiovascular: RRR  Respiratory: Diminished at bases  Abdomen: Soft/ND/NT, positive BS  Musculoskeletal: Bilateral lower extremity pitting edema  Neuro: alert, oriented   Data Reviewed: Basic Metabolic Panel: Recent Labs  Lab 02/20/18 0836 02/21/18 0450 02/22/18 0507 02/23/18 0517 02/24/18 0522  NA 143 141 142 142 141  K 3.7 3.5 3.7 3.4* 4.1  CL 111 109 108 107 105  CO2 23 25 23 26 25   GLUCOSE 150* 139* 163* 150* 184*  BUN 44* 51* 57* 61* 68*  CREATININE 3.68* 3.68* 3.94* 4.06* 4.25*  CALCIUM 8.4* 8.4* 8.3* 8.5* 8.4*   Liver Function Tests: Recent Labs  Lab 02/18/18 1207  AST 27  ALT 23  ALKPHOS 88  BILITOT 0.6  PROT 6.1*  ALBUMIN 3.3*   No results for input(s): LIPASE, AMYLASE in the last 168 hours. No results for input(s): AMMONIA in the last 168 hours. CBC: Recent Labs  Lab 02/18/18 1207 02/19/18 0518 02/23/18 0517 02/24/18  0522  WBC 8.4 6.4 4.6 4.7  NEUTROABS 6.3  --  3.1 3.5  HGB 10.4* 8.4* 7.6* 7.7*  HCT 34.0* 26.0* 24.7* 24.9*  MCV 93.2 91.9 93.2 94.3  PLT 114* 89* 106* 116*   Cardiac Enzymes:   Recent Labs  Lab 02/18/18 1207  TROPONINI 0.04*   BNP (last 3 results) Recent Labs    11/03/17 0035 12/21/17 1730 02/18/18 1207  BNP 447.4* 1,439.6* 920.0*    ProBNP (last 3 results) No results for input(s): PROBNP in the last 8760 hours.  CBG: Recent Labs  Lab 02/23/18 1741 02/23/18 2126 02/24/18 0736 02/24/18 1153  02/24/18 1701  GLUCAP 163* 151* 168* 165* 177*    No results found for this or any previous visit (from the past 240 hour(s)).   Studies: No results found.  Scheduled Meds: . sodium chloride   Intravenous Once  . amLODipine  10 mg Oral Daily  . aspirin  81 mg Oral Daily  . atorvastatin  40 mg Oral q1800  . bisacodyl  10 mg Rectal Daily  . carvedilol  25 mg Oral BID WC  . heparin injection (subcutaneous)  5,000 Units Subcutaneous Q8H  . hydrALAZINE  100 mg Oral TID  . insulin aspart  0-5 Units Subcutaneous QHS  . insulin aspart  0-9 Units Subcutaneous TID WC  . insulin aspart  3 Units Subcutaneous TID WC  . isosorbide mononitrate  120 mg Oral Daily  . levothyroxine  25 mcg Oral QAC breakfast  . metolazone  5 mg Oral Daily  . pantoprazole  40 mg Oral Daily  . polyethylene glycol  17 g Oral Daily  . senna-docusate  1 tablet Oral BID  . sodium bicarbonate  650 mg Oral Daily    Continuous Infusions: . sodium chloride 10 mL/hr at 02/24/18 1632  . furosemide 160 mg (02/24/18 1636)     Time spent: 25 mins I have personally reviewed and interpreted on  02/24/2018 daily labs, tele strips, imagings as discussed above under date review session and assessment and plans.  I reviewed all nursing notes, pharmacy notes, consultant notes,  vitals, pertinent old records  I have discussed plan of care as described above with RN , patient  on 02/24/2018   Albertine Grates MD, PhD  [facility] Pager [phone]. If 7PM-7AM, please contact night-coverage at [email or web address], password [facility] 02/24/2018, 7:19 PM  LOS: 6 days

## 2018-02-24 NOTE — Progress Notes (Signed)
[facility] Progress Note  Subjective: back on O2, desat's w/ walking, getting 1 unit prbc's today.  wts 101kg today, 2L UOP per day  Vitals:   02/24/18 0406 02/24/18 0414 02/24/18 1259 02/24/18 1431  BP: 137/73  134/62   Pulse: 80   76  Resp: 20     Temp: 98 F (36.7 C)     TempSrc: Oral     SpO2: 100%   93%  Weight:  101.1 kg    Height:        Inpatient medications: . sodium chloride   Intravenous Once  . amLODipine  10 mg Oral Daily  . aspirin  81 mg Oral Daily  . atorvastatin  40 mg Oral q1800  . bisacodyl  10 mg Rectal Daily  . carvedilol  50 mg Oral BID WC  . cloNIDine  0.3 mg Oral BID  . heparin injection (subcutaneous)  5,000 Units Subcutaneous Q8H  . hydrALAZINE  100 mg Oral TID  . insulin aspart  0-5 Units Subcutaneous QHS  . insulin aspart  0-9 Units Subcutaneous TID WC  . insulin aspart  3 Units Subcutaneous TID WC  . isosorbide mononitrate  120 mg Oral Daily  . levothyroxine  25 mcg Oral QAC breakfast  . pantoprazole  40 mg Oral Daily  . polyethylene glycol  17 g Oral Daily  . senna-docusate  1 tablet Oral BID  . sodium bicarbonate  650 mg Oral Daily   . sodium chloride 250 mL (02/23/18 1345)   sodium chloride, acetaminophen **OR** acetaminophen, albuterol, hydrALAZINE, ondansetron **OR** ondansetron (ZOFRAN) IV  Iron/TIBC/Ferritin/ %Sat    Component Value Date/Time   IRON 30 02/19/2018 0915   TIBC 240 (L) 02/19/2018 0915   FERRITIN 86 02/19/2018 0915   IRONPCTSAT 13 02/19/2018 0915    Exam: Gen alert, facial edema improved today No rash, cyanosis or gangrene Sclera anicteric, throat clear  No jvd or bruits Chest clear bilat no rales or wheezing RRR no MRG Abd soft ntnd no mass or ascites +bs GU defer MS no joint effusions or deformity Ext mild-mod dependent hip and pretib edema, improving Neuro is alert, Ox 3 , nf    Home meds:  - amlodipine 10/ carvedilol 50 bid/ furosemide 40 qd/ hydralazine 50 tid  - liraglutide  1.8mg  sq hs  - atorvastatin 40/ aspirin 81/ isosorbide mononitrate 30 qd  - pantoprazole 40/ potassium chloride 20 mwf/ sodium bicarb qd/ levothyroxine 25   Impression/ Plan: 1. Acute on CRF - baseline creat 2- 2.5, admitted w/ vol overload/ anasarca and mild CHF , up 8 kg compared to admission in August.  Has diuresed some but still DOE and O2 requiring, will add zaroxlyn, cont high dose IV lasix.  Creat rising unfortunately. No indication for HD yet hopefully can be avoided but patient aware we are at risk for needing HD.  Called son left a message.  2. DM2 - x 20 yrs 3. HTN - BP's down in normal range, will decrease  4. HL 5. Diast CHF    Vinson Moselleob Kvon Mcilhenny MD [facility] pager [phone]   02/24/2018, 2:36 PM   Recent Labs  Lab 02/18/18 1207  02/23/18 0517 02/24/18 0522  NA 146*   < > 142 141  K 4.1   < > 3.4* 4.1  CL 118*   < > 107 105  CO2 21*   < > 26 25  GLUCOSE 155*   < > 150* 184*  BUN 36*   < >  61* 68*  CREATININE 2.71*   < > 4.06* 4.25*  CALCIUM 8.6*   < > 8.5* 8.4*  ALBUMIN 3.3*  --   --   --    < > = values in this interval not displayed.   Recent Labs  Lab 02/18/18 1207  AST 27  ALT 23  ALKPHOS 88  BILITOT 0.6  PROT 6.1*   Recent Labs  Lab 02/23/18 0517 02/24/18 0522  WBC 4.6 4.7  NEUTROABS 3.1 3.5  HGB 7.6* 7.7*  HCT 24.7* 24.9*  MCV 93.2 94.3  PLT 106* 116*

## 2018-02-25 LAB — CBC WITH DIFFERENTIAL/PLATELET
Abs Immature Granulocytes: 0.08 10*3/uL — ABNORMAL HIGH (ref 0.00–0.07)
Basophils Absolute: 0 10*3/uL (ref 0.0–0.1)
Basophils Relative: 1 %
Eosinophils Absolute: 0.1 10*3/uL (ref 0.0–0.5)
Eosinophils Relative: 2 %
HCT: 29.9 % — ABNORMAL LOW (ref 36.0–46.0)
Hemoglobin: 9.5 g/dL — ABNORMAL LOW (ref 12.0–15.0)
Immature Granulocytes: 2 %
Lymphocytes Relative: 17 %
Lymphs Abs: 0.9 10*3/uL (ref 0.7–4.0)
MCH: 29.1 pg (ref 26.0–34.0)
MCHC: 31.8 g/dL (ref 30.0–36.0)
MCV: 91.7 fL (ref 80.0–100.0)
Monocytes Absolute: 0.4 10*3/uL (ref 0.1–1.0)
Monocytes Relative: 9 %
NEUTROS ABS: 3.6 10*3/uL (ref 1.7–7.7)
Neutrophils Relative %: 69 %
Platelets: 124 10*3/uL — ABNORMAL LOW (ref 150–400)
RBC: 3.26 MIL/uL — ABNORMAL LOW (ref 3.87–5.11)
RDW: 14.9 % (ref 11.5–15.5)
WBC: 5.1 10*3/uL (ref 4.0–10.5)
nRBC: 0 % (ref 0.0–0.2)

## 2018-02-25 LAB — BASIC METABOLIC PANEL
Anion gap: 10 (ref 5–15)
BUN: 72 mg/dL — AB (ref 6–20)
CO2: 25 mmol/L (ref 22–32)
Calcium: 8.9 mg/dL (ref 8.9–10.3)
Chloride: 103 mmol/L (ref 98–111)
Creatinine, Ser: 4.09 mg/dL — ABNORMAL HIGH (ref 0.44–1.00)
GFR calc Af Amer: 13 mL/min — ABNORMAL LOW (ref >60)
GFR calc non Af Amer: 11 mL/min — ABNORMAL LOW (ref >60)
Glucose, Bld: 175 mg/dL — ABNORMAL HIGH (ref 70–99)
POTASSIUM: 4.1 mmol/L (ref 3.5–5.1)
Sodium: 138 mmol/L (ref 135–145)

## 2018-02-25 LAB — TYPE AND SCREEN
ABO/RH(D): A POS
Antibody Screen: NEGATIVE
UNIT DIVISION: 0

## 2018-02-25 LAB — BPAM RBC
Blood Product Expiration Date: 201912302359
ISSUE DATE / TIME: 201912131836
Unit Type and Rh: 6200

## 2018-02-25 LAB — GLUCOSE, CAPILLARY
GLUCOSE-CAPILLARY: 154 mg/dL — AB (ref 70–99)
Glucose-Capillary: 179 mg/dL — ABNORMAL HIGH (ref 70–99)
Glucose-Capillary: 173 mg/dL — ABNORMAL HIGH (ref 70–99)

## 2018-02-25 MED ORDER — METOLAZONE 5 MG PO TABS
10.0000 mg | ORAL_TABLET | Freq: Every day | ORAL | Status: DC
  Administered 2018-02-26 – 2018-03-01 (×4): 10 mg via ORAL
  Filled 2018-02-25 (×4): qty 2

## 2018-02-25 MED ORDER — POLYETHYLENE GLYCOL 3350 17 G PO PACK
17.0000 g | PACK | Freq: Two times a day (BID) | ORAL | Status: DC
  Administered 2018-02-25 – 2018-03-01 (×7): 17 g via ORAL
  Filled 2018-02-25 (×7): qty 1

## 2018-02-25 MED ORDER — CLONAZEPAM 0.5 MG PO TABS: 0.2500 mg | ORAL_TABLET | Freq: Two times a day (BID) | ORAL | Status: DC | PRN

## 2018-02-25 NOTE — Progress Notes (Signed)
PROGRESS NOTE  Barbara Velazquez UJW:[id number] DOB: 06-23-1960 DOA: 02/18/2018 PCP: Bing NeighborsHarris, Kimberly S, FNP  HPI/Recap of past 24 hours:  Urine output 1.5L last 24 hours,  Almost negative 8liters since admission, creatinine appears to start trend down  She denies of chest pain, edema is improving No fever  Reports constipated    Assessment/Plan: Active Problems:   Hypertension   DM (diabetes mellitus), type 2 with renal complications (HCC)   CKD (chronic kidney disease), stage IV (HCC)   Acute on chronic respiratory failure with hypoxia (HCC)   Acute diastolic heart failure (HCC)   Acute pulmonary edema (HCC)   ACute respiratory failure with hypoxia due to acute decompensated diastolic heart failure in the setting of acute pulmonary edema: Her est. dry weight around 96-97 kg back in October 2019 Coreg, Norvasc, hydralazine and Imdur.  Iv lasix /metolazone Per nephrology  Essential hypertension: Blood pressure is hard to control, significantly increased. Continue max dose of Norvasc, Coreg, hydralazine, Imdur She is started on clonidine   Chronic thrombocytopenia: plt fluctuating From liver congestion? No sign of bleeding monitor  AKI on CKDIII/IV -cr seems now peaked at 4.25, start to trend down -nephrology consulted, will follow recommendations    Anemia of chronic disease: hgb stable at baseline around 9, hgb dropped to 7.6 with significant dyspnea on exertion, will give one unit of blood hgb improved after prbc transfusion, dyspnea improved No sign of bleeding  Insulin dependent dm2, controlled -a1c6.3 -was on insulin glargine 25unit qhs and victoza qhs at home I donot see insulin on home med list, will verify with patient -currently on ssi  HLD; on statin  Obesity:/OSA on cpap Body mass index is 35.11 kg/m.   Code Status: full  Family Communication: patient   Disposition Plan: not ready to  discharge   Consultants:  nephrology  Procedures:  none  Antibiotics:  none   Objective: BP (!) 154/73 (BP Location: Right Arm)   Pulse 78   Temp 97.9 F (36.6 C) (Oral)   Resp 18   Ht 5\' 7"  (1.702 m)   Wt 101.7 kg   SpO2 99%   BMI 35.11 kg/m   Intake/Output Summary (Last 24 hours) at 02/25/2018 0801 Last data filed at 02/25/2018 0518 Gross per 24 hour  Intake 760 ml  Output 1550 ml  Net -790 ml   Filed Weights   02/23/18 0557 02/24/18 0414 02/25/18 0557  Weight: 102.7 kg 101.1 kg 101.7 kg    Exam: Patient is examined daily including today on 02/25/2018, exams remain the same as of yesterday except that has changed    General:  NAD  Cardiovascular: RRR  Respiratory: improved aeration , no wheezing, no rales, no rhonchi  Abdomen: Soft/ND/NT, positive BS  Musculoskeletal: Bilateral lower extremity pitting edema is improving  Neuro: alert, oriented   Data Reviewed: Basic Metabolic Panel: Recent Labs  Lab 02/21/18 0450 02/22/18 0507 02/23/18 0517 02/24/18 0522 02/25/18 0444  NA 141 142 142 141 138  K 3.5 3.7 3.4* 4.1 4.1  CL 109 108 107 105 103  CO2 25 23 26 25 25   GLUCOSE 139* 163* 150* 184* 175*  BUN 51* 57* 61* 68* 72*  CREATININE 3.68* 3.94* 4.06* 4.25* 4.09*  CALCIUM 8.4* 8.3* 8.5* 8.4* 8.9   Liver Function Tests: Recent Labs  Lab 02/18/18 1207  AST 27  ALT 23  ALKPHOS 88  BILITOT 0.6  PROT 6.1*  ALBUMIN 3.3*   No results for input(s): LIPASE, AMYLASE in the last  168 hours. No results for input(s): AMMONIA in the last 168 hours. CBC: Recent Labs  Lab 02/18/18 1207 02/19/18 0518 02/23/18 0517 02/24/18 0522 02/25/18 0444  WBC 8.4 6.4 4.6 4.7 5.1  NEUTROABS 6.3  --  3.1 3.5 3.6  HGB 10.4* 8.4* 7.6* 7.7* 9.5*  HCT 34.0* 26.0* 24.7* 24.9* 29.9*  MCV 93.2 91.9 93.2 94.3 91.7  PLT 114* 89* 106* 116* 124*   Cardiac Enzymes:   Recent Labs  Lab 02/18/18 1207  TROPONINI 0.04*   BNP (last 3 results) Recent Labs     11/03/17 0035 12/21/17 1730 02/18/18 1207  BNP 447.4* 1,439.6* 920.0*    ProBNP (last 3 results) No results for input(s): PROBNP in the last 8760 hours.  CBG: Recent Labs  Lab 02/23/18 2126 02/24/18 0736 02/24/18 1153 02/24/18 1701 02/24/18 2127  GLUCAP 151* 168* 165* 177* 172*    No results found for this or any previous visit (from the past 240 hour(s)).   Studies: No results found.  Scheduled Meds: . sodium chloride   Intravenous Once  . amLODipine  10 mg Oral Daily  . aspirin  81 mg Oral Daily  . atorvastatin  40 mg Oral q1800  . bisacodyl  10 mg Rectal Daily  . carvedilol  25 mg Oral BID WC  . heparin injection (subcutaneous)  5,000 Units Subcutaneous Q8H  . hydrALAZINE  100 mg Oral TID  . insulin aspart  0-5 Units Subcutaneous QHS  . insulin aspart  0-9 Units Subcutaneous TID WC  . insulin aspart  3 Units Subcutaneous TID WC  . isosorbide mononitrate  120 mg Oral Daily  . levothyroxine  25 mcg Oral QAC breakfast  . metolazone  5 mg Oral Daily  . pantoprazole  40 mg Oral Daily  . polyethylene glycol  17 g Oral Daily  . senna-docusate  1 tablet Oral BID  . sodium bicarbonate  650 mg Oral Daily    Continuous Infusions: . sodium chloride 10 mL/hr at 02/24/18 1632  . furosemide 160 mg (02/25/18 0506)     Time spent: 25 mins I have personally reviewed and interpreted on  02/25/2018 daily labs, tele strips, imagings as discussed above under date review session and assessment and plans.  I reviewed all nursing notes, pharmacy notes, consultant notes,  vitals, pertinent old records  I have discussed plan of care as described above with RN , patient  on 02/25/2018   Albertine Grates MD, PhD  [facility] Pager [phone]. If 7PM-7AM, please contact night-coverage at [email or web address], password [facility] 02/25/2018, 8:01 AM  LOS: 7 days

## 2018-02-25 NOTE — Progress Notes (Signed)
SATURATION QUALIFICATIONS: (This note is used to comply with regulatory documentation for home oxygen)  Patient Saturations on Room Air at Rest = 96%  Patient Saturations on Room Air while Ambulating = 84%  Patient Saturations on 2 Liters of oxygen while Ambulating = 94-96%  Please briefly explain why patient needs home oxygen: Patient needs oxygen to maintain O2 sat above 90% while ambulating.   Earnest ConroyBrooke M. Clelia CroftShaw, RN

## 2018-02-25 NOTE — Progress Notes (Signed)
[facility] Progress Note  Subjective: still requiring O2, wt's up today, 1.8L UOP yest  Vitals:   02/24/18 1838 02/24/18 1855 02/24/18 2210 02/25/18 0557  BP: 131/65 129/66 128/66 (!) 154/73  Pulse: 70 69 70 78  Resp: 18 18 18 18   Temp: (!) 97.5 F (36.4 C) (!) 97.5 F (36.4 C) 97.6 F (36.4 C) 97.9 F (36.6 C)  TempSrc: Oral Oral Axillary Oral  SpO2: 100% 99%  99%  Weight:    101.7 kg  Height:        Inpatient medications: . sodium chloride   Intravenous Once  . amLODipine  10 mg Oral Daily  . aspirin  81 mg Oral Daily  . atorvastatin  40 mg Oral q1800  . bisacodyl  10 mg Rectal Daily  . carvedilol  25 mg Oral BID WC  . heparin injection (subcutaneous)  5,000 Units Subcutaneous Q8H  . hydrALAZINE  100 mg Oral TID  . insulin aspart  0-5 Units Subcutaneous QHS  . insulin aspart  0-9 Units Subcutaneous TID WC  . insulin aspart  3 Units Subcutaneous TID WC  . isosorbide mononitrate  120 mg Oral Daily  . levothyroxine  25 mcg Oral QAC breakfast  . metolazone  5 mg Oral Daily  . pantoprazole  40 mg Oral Daily  . polyethylene glycol  17 g Oral Daily  . senna-docusate  1 tablet Oral BID  . sodium bicarbonate  650 mg Oral Daily   . sodium chloride 10 mL/hr at 02/24/18 1632  . furosemide 160 mg (02/25/18 0506)   sodium chloride, acetaminophen **OR** acetaminophen, albuterol, hydrALAZINE, ondansetron **OR** ondansetron (ZOFRAN) IV  Iron/TIBC/Ferritin/ %Sat    Component Value Date/Time   IRON 30 02/19/2018 0915   TIBC 240 (L) 02/19/2018 0915   FERRITIN 86 02/19/2018 0915   IRONPCTSAT 13 02/19/2018 0915    Exam: Gen alert, facial edema improved today No rash, cyanosis or gangrene Sclera anicteric, throat clear  No jvd or bruits Chest clear bilat no rales or wheezing RRR no MRG Abd soft ntnd no mass or ascites +bs GU defer MS no joint effusions or deformity Ext mild-mod dependent hip and pretib edema, improving Neuro is alert, Ox 3 , nf    Home meds:  - amlodipine 10/ carvedilol 50 bid/ furosemide 40 qd/ hydralazine 50 tid  - liraglutide 1.8mg  sq hs  - atorvastatin 40/ aspirin 81/ isosorbide mononitrate 30 qd  - pantoprazole 40/ potassium chloride 20 mwf/ sodium bicarb qd/ levothyroxine 25   Impression/ Plan: 1. Acute on CRF - baseline creat 2- 2.5, admitted w/ vol overload. With diuresis creat up 4's, still vol excess. Hypoxemic off O2. Repeat CXR.  Relative drop in BP's since admission may be affecting renal fxn have reduced BP meds to let BP's come up some.  ^Metolazone, cont max IV lasix , encouraged fluid restriction.  No indication for dialysis yet.  2. DM2 - x 20 yrs 3. HTN - let BP's come around 160- 170 systolic 4. HL 5. Diast CHF - EF 55%    Vinson Moselle MD [facility] pager [phone]   02/25/2018, 1:12 PM   Recent Labs  Lab 02/24/18 0522 02/25/18 0444  NA 141 138  K 4.1 4.1  CL 105 103  CO2 25 25  GLUCOSE 184* 175*  BUN 68* 72*  CREATININE 4.25* 4.09*  CALCIUM 8.4* 8.9   No results for input(s): AST, ALT, ALKPHOS, BILITOT, PROT in the last 168 hours. Recent Labs  Lab  02/24/18 0522 02/25/18 0444  WBC 4.7 5.1  NEUTROABS 3.5 3.6  HGB 7.7* 9.5*  HCT 24.9* 29.9*  MCV 94.3 91.7  PLT 116* 124*

## 2018-02-26 LAB — BASIC METABOLIC PANEL
Anion gap: 11 (ref 5–15)
BUN: 78 mg/dL — AB (ref 6–20)
CO2: 30 mmol/L (ref 22–32)
Calcium: 9.3 mg/dL (ref 8.9–10.3)
Chloride: 102 mmol/L (ref 98–111)
Creatinine, Ser: 3.91 mg/dL — ABNORMAL HIGH (ref 0.44–1.00)
GFR calc Af Amer: 14 mL/min — ABNORMAL LOW (ref >60)
GFR calc non Af Amer: 12 mL/min — ABNORMAL LOW (ref >60)
Glucose, Bld: 148 mg/dL — ABNORMAL HIGH (ref 70–99)
POTASSIUM: 4.3 mmol/L (ref 3.5–5.1)
Sodium: 143 mmol/L (ref 135–145)

## 2018-02-26 LAB — OCCULT BLOOD X 1 CARD TO LAB, STOOL: Fecal Occult Bld: NEGATIVE

## 2018-02-26 LAB — GLUCOSE, CAPILLARY
Glucose-Capillary: 149 mg/dL — ABNORMAL HIGH (ref 70–99)
Glucose-Capillary: 202 mg/dL — ABNORMAL HIGH (ref 70–99)

## 2018-02-26 LAB — BRAIN NATRIURETIC PEPTIDE: B Natriuretic Peptide: 525.1 pg/mL — ABNORMAL HIGH (ref 0.0–100.0)

## 2018-02-26 MED ORDER — HYDRALAZINE HCL 50 MG PO TABS
50.0000 mg | ORAL_TABLET | Freq: Three times a day (TID) | ORAL | Status: DC
  Administered 2018-02-26 – 2018-03-01 (×8): 50 mg via ORAL
  Filled 2018-02-26 (×8): qty 1

## 2018-02-26 MED ORDER — AMLODIPINE BESYLATE 5 MG PO TABS
5.0000 mg | ORAL_TABLET | Freq: Every day | ORAL | Status: DC
  Administered 2018-02-27: 5 mg via ORAL
  Filled 2018-02-26: qty 1

## 2018-02-26 NOTE — Progress Notes (Signed)
PROGRESS NOTE  Barbara Velazquez NAT:[id number] DOB: 04-Jan-1961 DOA: 02/18/2018 PCP: Bing Neighbors, FNP  HPI/Recap of past 24 hours:  She tolerated CPAP last night Urine output 12L last 24 hours,  Almost negative 10liters since admission, creatinine appears to start trend down  She denies of chest pain, no cough, edema is improving Patient Saturations on Room Air while Ambulating = 84%,  report dyspnea dyspnea on exertion is improving No fever    Assessment/Plan: Active Problems:   Hypertension   DM (diabetes mellitus), type 2 with renal complications (HCC)   CKD (chronic kidney disease), stage IV (HCC)   Acute on chronic respiratory failure with hypoxia (HCC)   Acute diastolic heart failure (HCC)   Acute pulmonary edema (HCC)   ACute respiratory failure with hypoxia due to acute decompensated diastolic heart failure in the setting of acute pulmonary edema: Her est. dry weight around 96-97 kg back in October 2019 Coreg, Norvasc, hydralazine and Imdur.  Iv lasix /metolazone Per nephrology  Telemetry has been unremarkable, patient is improving, will discontinue telemetry.  Essential hypertension: Continue max dose of Norvasc, Coreg, hydralazine, Imdur She is started on clonidine , clonidine discontinued on 12/14 Monitor blood pressure  Chronic thrombocytopenia: plt improving From liver congestion? No sign of bleeding monitor  AKI on CKDIII/IV -cr seems now peaked at 4.25, start to trend down -nephrology consulted, will follow recommendations    Anemia of chronic disease: hgb stable at baseline around 9, hgb dropped to 7.6 with significant dyspnea on exertion, s/p one unit of blood hgb on 12/13, hgb  improved after prbc transfusion, dyspnea improved No sign of bleeding  Insulin dependent dm2, controlled -a1c6.3 -was on insulin glargine 25unit qhs and victoza qhs at home I donot see insulin on home med list, will verify with patient -currently on  ssi  HLD; on statin  Obesity:/OSA on cpap Body mass index is 34.46 kg/m.   Code Status: full  Family Communication: patient   Disposition Plan: Hopefully home with son  in 1 to 2 days, need nephrology clearance   Consultants:  nephrology  Procedures:  none  Antibiotics:  none   Objective: BP (!) 158/82 (BP Location: Right Arm)   Pulse 79   Temp 97.9 F (36.6 C) (Oral)   Resp 18   Ht 5\' 7"  (1.702 m)   Wt 99.8 kg   SpO2 97%   BMI 34.46 kg/m   Intake/Output Summary (Last 24 hours) at 02/26/2018 1209 Last data filed at 02/26/2018 0900 Gross per 24 hour  Intake -  Output 2200 ml  Net -2200 ml   Filed Weights   02/24/18 0414 02/25/18 0557 02/26/18 0456  Weight: 101.1 kg 101.7 kg 99.8 kg    Exam: Patient is examined daily including today on 02/26/2018, exams remain the same as of yesterday except that has changed    General:  NAD  Cardiovascular: RRR  Respiratory: improved aeration , no wheezing, no rales, no rhonchi  Abdomen: Soft/ND/NT, positive BS  Musculoskeletal: Bilateral lower extremity pitting edema is improving  Neuro: alert, oriented   Data Reviewed: Basic Metabolic Panel: Recent Labs  Lab 02/22/18 0507 02/23/18 0517 02/24/18 0522 02/25/18 0444 02/26/18 0519  NA 142 142 141 138 143  K 3.7 3.4* 4.1 4.1 4.3  CL 108 107 105 103 102  CO2 23 26 25 25 30   GLUCOSE 163* 150* 184* 175* 148*  BUN 57* 61* 68* 72* 78*  CREATININE 3.94* 4.06* 4.25* 4.09* 3.91*  CALCIUM 8.3* 8.5* 8.4*  8.9 9.3   Liver Function Tests: No results for input(s): AST, ALT, ALKPHOS, BILITOT, PROT, ALBUMIN in the last 168 hours. No results for input(s): LIPASE, AMYLASE in the last 168 hours. No results for input(s): AMMONIA in the last 168 hours. CBC: Recent Labs  Lab 02/23/18 0517 02/24/18 0522 02/25/18 0444  WBC 4.6 4.7 5.1  NEUTROABS 3.1 3.5 3.6  HGB 7.6* 7.7* 9.5*  HCT 24.7* 24.9* 29.9*  MCV 93.2 94.3 91.7  PLT 106* 116* 124*   Cardiac  Enzymes:   No results for input(s): CKTOTAL, CKMB, CKMBINDEX, TROPONINI in the last 168 hours. BNP (last 3 results) Recent Labs    12/21/17 1730 02/18/18 1207 02/26/18 0519  BNP 1,439.6* 920.0* 525.1*    ProBNP (last 3 results) No results for input(s): PROBNP in the last 8760 hours.  CBG: Recent Labs  Lab 02/24/18 2127 02/25/18 0804 02/25/18 1201 02/25/18 1647 02/26/18 0801  GLUCAP 172* 154* 179* 173* 149*    No results found for this or any previous visit (from the past 240 hour(s)).   Studies: No results found.  Scheduled Meds: . sodium chloride   Intravenous Once  . amLODipine  10 mg Oral Daily  . aspirin  81 mg Oral Daily  . atorvastatin  40 mg Oral q1800  . bisacodyl  10 mg Rectal Daily  . carvedilol  25 mg Oral BID WC  . heparin injection (subcutaneous)  5,000 Units Subcutaneous Q8H  . hydrALAZINE  100 mg Oral TID  . insulin aspart  0-5 Units Subcutaneous QHS  . insulin aspart  0-9 Units Subcutaneous TID WC  . insulin aspart  3 Units Subcutaneous TID WC  . levothyroxine  25 mcg Oral QAC breakfast  . metolazone  10 mg Oral Daily  . pantoprazole  40 mg Oral Daily  . polyethylene glycol  17 g Oral BID  . senna-docusate  1 tablet Oral BID    Continuous Infusions: . sodium chloride 10 mL/hr at 02/24/18 1632  . furosemide 160 mg (02/26/18 0510)     Time spent: 25 mins I have personally reviewed and interpreted on  02/26/2018 daily labs, tele strips, imagings as discussed above under date review session and assessment and plans.  I reviewed all nursing notes, pharmacy notes, consultant notes,  vitals, pertinent old records  I have discussed plan of care as described above with RN , patient  on 02/26/2018   Albertine GratesFang Anysa Tacey MD, PhD  [facility] Pager [phone]. If 7PM-7AM, please contact night-coverage at [email or web address], password [email or web address] 02/26/2018, 12:09 PM  LOS: 8 days

## 2018-02-26 NOTE — Progress Notes (Addendum)
[facility] Progress Note  Subjective: still requiring O2, wt's down today, po intake down and UOP up 2.0L. creat down 3.9.  BP's down some 135/ 75  Vitals:   02/25/18 2332 02/26/18 0456 02/26/18 0501 02/26/18 1300  BP: (!) 146/70  (!) 158/82 137/68  Pulse: 78  79 77  Resp: 18  18 18   Temp: 98.1 F (36.7 C)  97.9 F (36.6 C) 98.7 F (37.1 C)  TempSrc: Axillary  Oral Oral  SpO2: 100%  97% 98%  Weight:  99.8 kg    Height:        Inpatient medications: . sodium chloride   Intravenous Once  . [START ON 02/27/2018] amLODipine  5 mg Oral Daily  . aspirin  81 mg Oral Daily  . atorvastatin  40 mg Oral q1800  . bisacodyl  10 mg Rectal Daily  . carvedilol  25 mg Oral BID WC  . heparin injection (subcutaneous)  5,000 Units Subcutaneous Q8H  . hydrALAZINE  50 mg Oral TID  . insulin aspart  0-5 Units Subcutaneous QHS  . insulin aspart  0-9 Units Subcutaneous TID WC  . insulin aspart  3 Units Subcutaneous TID WC  . levothyroxine  25 mcg Oral QAC breakfast  . metolazone  10 mg Oral Daily  . pantoprazole  40 mg Oral Daily  . polyethylene glycol  17 g Oral BID  . senna-docusate  1 tablet Oral BID   . sodium chloride 10 mL/hr at 02/24/18 1632  . furosemide 160 mg (02/26/18 1232)   sodium chloride, acetaminophen **OR** acetaminophen, albuterol, hydrALAZINE, ondansetron **OR** ondansetron (ZOFRAN) IV  Iron/TIBC/Ferritin/ %Sat    Component Value Date/Time   IRON 30 02/19/2018 0915   TIBC 240 (L) 02/19/2018 0915   FERRITIN 86 02/19/2018 0915   IRONPCTSAT 13 02/19/2018 0915    Exam: Gen alert, facial edema  Sclera anicteric, throat clear  No jvd or bruits Chest clear bilat no rales or wheezing RRR no MRG Abd soft ntnd no mass or ascites +bs Ext mild-mod dependent hip and pretib edema Neuro is alert, Ox 3 , nf    Home meds:  - amlodipine 10/ carvedilol 50 bid/ furosemide 40 qd/ hydralazine 50 tid  - liraglutide 1.8mg  sq hs  - atorvastatin 40/ aspirin 81/  isosorbide mononitrate 30 qd  - pantoprazole 40/ potassium chloride 20 mwf/ sodium bicarb qd/ levothyroxine 25   Impression/ Plan:  Acute on CRF - baseline creat 2- 2.5, admitted w/ vol overload. With diuresis creat peaked 4.25, now improving 3.9 today. Fluid intake down per pt which is helping a lot. Wt's down today.  Cont IV diuretics max dose.  Will decrease BP meds as she diureses.   DM2 - x 20 yrs  HTN - will reduce BP meds again today. OK for BP's to run a little high.   HL  Diast CHF - EF 55%    Barbara Velazquez Barbara Stepp MD [facility] pager [phone]   02/26/2018, 5:04 PM   Recent Labs  Lab 02/25/18 0444 02/26/18 0519  NA 138 143  K 4.1 4.3  CL 103 102  CO2 25 30  GLUCOSE 175* 148*  BUN 72* 78*  CREATININE 4.09* 3.91*  CALCIUM 8.9 9.3   No results for input(s): AST, ALT, ALKPHOS, BILITOT, PROT in the last 168 hours. Recent Labs  Lab 02/24/18 0522 02/25/18 0444  WBC 4.7 5.1  NEUTROABS 3.5 3.6  HGB 7.7* 9.5*  HCT 24.9* 29.9*  MCV 94.3 91.7  PLT 116*  124*      

## 2018-02-27 ENCOUNTER — Inpatient Hospital Stay (HOSPITAL_COMMUNITY): Payer: Self-pay

## 2018-02-27 DIAGNOSIS — N179 Acute kidney failure, unspecified: Secondary | ICD-10-CM

## 2018-02-27 DIAGNOSIS — E1121 Type 2 diabetes mellitus with diabetic nephropathy: Secondary | ICD-10-CM

## 2018-02-27 DIAGNOSIS — R609 Edema, unspecified: Secondary | ICD-10-CM

## 2018-02-27 LAB — BASIC METABOLIC PANEL
Anion gap: 11 (ref 5–15)
BUN: 78 mg/dL — ABNORMAL HIGH (ref 6–20)
CO2: 31 mmol/L (ref 22–32)
Calcium: 9.2 mg/dL (ref 8.9–10.3)
Chloride: 100 mmol/L (ref 98–111)
Creatinine, Ser: 3.98 mg/dL — ABNORMAL HIGH (ref 0.44–1.00)
GFR calc non Af Amer: 12 mL/min — ABNORMAL LOW (ref >60)
GFR, EST AFRICAN AMERICAN: 14 mL/min — AB (ref >60)
Glucose, Bld: 145 mg/dL — ABNORMAL HIGH (ref 70–99)
Potassium: 3.7 mmol/L (ref 3.5–5.1)
Sodium: 142 mmol/L (ref 135–145)

## 2018-02-27 LAB — GLUCOSE, CAPILLARY
GLUCOSE-CAPILLARY: 114 mg/dL — AB (ref 70–99)
GLUCOSE-CAPILLARY: 136 mg/dL — AB (ref 70–99)
Glucose-Capillary: 143 mg/dL — ABNORMAL HIGH (ref 70–99)
Glucose-Capillary: 123 mg/dL — ABNORMAL HIGH (ref 70–99)
Glucose-Capillary: 173 mg/dL — ABNORMAL HIGH (ref 70–99)
Glucose-Capillary: 197 mg/dL — ABNORMAL HIGH (ref 70–99)

## 2018-02-27 MED ORDER — CARVEDILOL 25 MG PO TABS
50.0000 mg | ORAL_TABLET | Freq: Two times a day (BID) | ORAL | Status: DC
  Administered 2018-02-27 – 2018-03-01 (×4): 50 mg via ORAL
  Filled 2018-02-27 (×4): qty 2

## 2018-02-27 MED ORDER — AMLODIPINE BESYLATE 10 MG PO TABS
10.0000 mg | ORAL_TABLET | Freq: Every day | ORAL | Status: DC
  Administered 2018-02-28 – 2018-03-01 (×2): 10 mg via ORAL
  Filled 2018-02-27 (×2): qty 1

## 2018-02-27 NOTE — Progress Notes (Signed)
PROGRESS NOTE  Barbara BeltKarene Velazquez ZOX:[id number] DOB: 1960/05/20 DOA: 02/18/2018 PCP: Bing NeighborsHarris, Kimberly S, FNP  HPI/Recap of past 24 hours:  She tolerated CPAP last night Urine output 2.4L last 24 hours,  Almost negative 12liters since admission, creatinine appears to  trend down  She denies of chest pain, no cough, edema is improving Patient Saturations on Room Air while Ambulating = 84%,  report dyspnea on exertion is improving No fever    Assessment/Plan: Active Problems:   Hypertension   DM (diabetes mellitus), type 2 with renal complications (HCC)   CKD (chronic kidney disease), stage IV (HCC)   Acute on chronic respiratory failure with hypoxia (HCC)   Acute diastolic heart failure (HCC)   Acute pulmonary edema (HCC)   ACute respiratory failure with hypoxia due to acute decompensated diastolic heart failure in the setting of acute pulmonary edema: Her est. dry weight around 96-97 kg back in October 2019 Coreg, Norvasc, hydralazine and Imdur.  Iv lasix /metolazone Per nephrology  Volume improving, remain oxygen dependent, will repeat  venous doppler bilateral lower extremity ( negative for DVT in 10/2017 ) Will get ddimer, not able to get CTA due to renal function, if ddimer elevated , will get VQ scan if cxr ok.  Essential hypertension: Currently on  Norvasc, Coreg, hydralazine per nephrology,  Imdur and clonidine discontinued per nephrology recommendation Monitor blood pressure Nephrology input appreciated  Chronic thrombocytopenia: plt improving From liver congestion? No sign of bleeding monitor  AKI on CKDIII/IV -cr seems now peaked at 4.25, start to trend down -nephrology consulted, will follow recommendations   Anemia of chronic disease: hgb stable at baseline around 9, hgb dropped to 7.6 with significant dyspnea on exertion, s/p one unit of blood hgb on 12/13, hgb  improved after prbc transfusion, dyspnea improved No sign of bleeding, FOBT  negative  Insulin dependent dm2, controlled -a1c6.3 -was on insulin glargine 25unit qhs and victoza qhs at home I donot see insulin on home med list, will verify with patient -currently on ssi  HLD; on statin  Obesity:/OSA on cpap Body mass index is 33.98 kg/m.   Code Status: full  Family Communication: patient   Disposition Plan: Hopefully home with son  Mid to later this week, need nephrology clearance   Consultants:  nephrology  Procedures:  none  Antibiotics:  none   Objective: BP (!) 167/75 (BP Location: Left Arm)   Pulse 92   Temp 98.5 F (36.9 C) (Oral)   Resp 17   Ht 5\' 7"  (1.702 m)   Wt 98.4 kg   SpO2 99%   BMI 33.98 kg/m   Intake/Output Summary (Last 24 hours) at 02/27/2018 1238 Last data filed at 02/27/2018 [id number] Gross per 24 hour  Intake 349.3 ml  Output 1850 ml  Net -1500.7 ml   Filed Weights   02/25/18 0557 02/26/18 0456 02/27/18 0515  Weight: 101.7 kg 99.8 kg 98.4 kg    Exam: Patient is examined daily including today on 02/27/2018, exams remain the same as of yesterday except that has changed    General:  NAD  Cardiovascular: RRR  Respiratory: improved aeration , no wheezing, no rales, no rhonchi  Abdomen: Soft/ND/NT, positive BS  Musculoskeletal: Bilateral lower extremity pitting edema is improving  Neuro: alert, oriented   Data Reviewed: Basic Metabolic Panel: Recent Labs  Lab 02/23/18 0517 02/24/18 0522 02/25/18 0444 02/26/18 0519 02/27/18 0456  NA 142 141 138 143 142  K 3.4* 4.1 4.1 4.3 3.7  CL 107 105 103  102 100  CO2 26 25 25 30 31   GLUCOSE 150* 184* 175* 148* 145*  BUN 61* 68* 72* 78* 78*  CREATININE 4.06* 4.25* 4.09* 3.91* 3.98*  CALCIUM 8.5* 8.4* 8.9 9.3 9.2   Liver Function Tests: No results for input(s): AST, ALT, ALKPHOS, BILITOT, PROT, ALBUMIN in the last 168 hours. No results for input(s): LIPASE, AMYLASE in the last 168 hours. No results for input(s): AMMONIA in the last 168  hours. CBC: Recent Labs  Lab 02/23/18 0517 02/24/18 0522 02/25/18 0444  WBC 4.6 4.7 5.1  NEUTROABS 3.1 3.5 3.6  HGB 7.6* 7.7* 9.5*  HCT 24.7* 24.9* 29.9*  MCV 93.2 94.3 91.7  PLT 106* 116* 124*   Cardiac Enzymes:   No results for input(s): CKTOTAL, CKMB, CKMBINDEX, TROPONINI in the last 168 hours. BNP (last 3 results) Recent Labs    12/21/17 1730 02/18/18 1207 02/26/18 0519  BNP 1,439.6* 920.0* 525.1*    ProBNP (last 3 results) No results for input(s): PROBNP in the last 8760 hours.  CBG: Recent Labs  Lab 02/26/18 0801 02/26/18 1225 02/26/18 1725 02/26/18 2106 02/27/18 0749  GLUCAP 149* 202* 143* 114* 123*    No results found for this or any previous visit (from the past 240 hour(s)).   Studies: No results found.  Scheduled Meds: . amLODipine  5 mg Oral Daily  . aspirin  81 mg Oral Daily  . atorvastatin  40 mg Oral q1800  . bisacodyl  10 mg Rectal Daily  . carvedilol  25 mg Oral BID WC  . heparin injection (subcutaneous)  5,000 Units Subcutaneous Q8H  . hydrALAZINE  50 mg Oral TID  . insulin aspart  0-5 Units Subcutaneous QHS  . insulin aspart  0-9 Units Subcutaneous TID WC  . insulin aspart  3 Units Subcutaneous TID WC  . levothyroxine  25 mcg Oral QAC breakfast  . metolazone  10 mg Oral Daily  . pantoprazole  40 mg Oral Daily  . polyethylene glycol  17 g Oral BID  . senna-docusate  1 tablet Oral BID    Continuous Infusions: . sodium chloride 10 mL/hr at 02/24/18 1632  . furosemide 66 mL/hr at 02/27/18 1610     Time spent: 25 mins, case discussed with nephrology Dr Arlean Hopping  I have personally reviewed and interpreted on  02/27/2018 daily labs, tele strips, imagings as discussed above under date review session and assessment and plans.  I reviewed all nursing notes, pharmacy notes, consultant notes,  vitals, pertinent old records  I have discussed plan of care as described above with RN , patient  on 02/27/2018   Albertine Grates MD, PhD  [facility] Pager [phone]. If 7PM-7AM, please contact night-coverage at [email or web address], password [facility] 02/27/2018, 12:38 PM  LOS: 9 days

## 2018-02-27 NOTE — Progress Notes (Signed)
[facility] Progress Note  Subjective: very low po fliud intake, 2.4 L UOP , creat stable at 3.9.  Still w/ DOE and requiring nasal O2. Wt's down 98.4kg.    Vitals:   02/26/18 1300 02/26/18 2107 02/27/18 0513 02/27/18 0515  BP: 137/68 (!) 158/74 (!) 167/75   Pulse: 77 78 92   Resp: 18 18 17    Temp: 98.7 F (37.1 C) 97.9 F (36.6 C) 98.5 F (36.9 C)   TempSrc: Oral Oral Oral   SpO2: 98% 100% 99%   Weight:    98.4 kg  Height:        Inpatient medications: . amLODipine  5 mg Oral Daily  . aspirin  81 mg Oral Daily  . atorvastatin  40 mg Oral q1800  . bisacodyl  10 mg Rectal Daily  . carvedilol  25 mg Oral BID WC  . heparin injection (subcutaneous)  5,000 Units Subcutaneous Q8H  . hydrALAZINE  50 mg Oral TID  . insulin aspart  0-5 Units Subcutaneous QHS  . insulin aspart  0-9 Units Subcutaneous TID WC  . insulin aspart  3 Units Subcutaneous TID WC  . levothyroxine  25 mcg Oral QAC breakfast  . metolazone  10 mg Oral Daily  . pantoprazole  40 mg Oral Daily  . polyethylene glycol  17 g Oral BID  . senna-docusate  1 tablet Oral BID   . sodium chloride 250 mL (02/27/18 1303)  . furosemide 160 mg (02/27/18 1304)   sodium chloride, acetaminophen **OR** acetaminophen, albuterol, hydrALAZINE, ondansetron **OR** ondansetron (ZOFRAN) IV  Iron/TIBC/Ferritin/ %Sat    Component Value Date/Time   IRON 30 02/19/2018 0915   TIBC 240 (L) 02/19/2018 0915   FERRITIN 86 02/19/2018 0915   IRONPCTSAT 13 02/19/2018 0915    Exam: Gen alert, facial edema  Sclera anicteric, throat clear  No jvd or bruits Chest clear bilat no rales or wheezing RRR no MRG Abd soft ntnd no mass or ascites +bs Ext mild-mod dependent hip and pretib edema Neuro is alert, Ox 3 , nf    Home meds:  - amlodipine 10/ carvedilol 50 bid/ furosemide 40 qd/ hydralazine 50 tid  - liraglutide 1.8mg  sq hs  - atorvastatin 40/ aspirin 81/ isosorbide mononitrate 30 qd  - pantoprazole 40/ potassium  chloride 20 mwf/ sodium bicarb qd/ levothyroxine 25   Impression/ Plan:  Acute on CRF - baseline creat 2- 2.5, admitted w/ vol overload. With diuresis creat peaked 4.25, now improving 3.9 range. Fluid intake down per pt which is helping a lot. Wt's cont to come down.  Still sig vol overload on exam.  Cont IV diuretics max dose for another few days most likely.  Will decrease BP meds as she diureses.   DM2 - x 20 yrs  HTN - BP's up, will ^BP meds some  HL  Diast CHF - EF 55%    Vinson Moselleob Ishana Blades MD [facility] pager [phone]   02/27/2018, 1:22 PM   Recent Labs  Lab 02/26/18 0519 02/27/18 0456  NA 143 142  K 4.3 3.7  CL 102 100  CO2 30 31  GLUCOSE 148* 145*  BUN 78* 78*  CREATININE 3.91* 3.98*  CALCIUM 9.3 9.2   No results for input(s): AST, ALT, ALKPHOS, BILITOT, PROT in the last 168 hours. Recent Labs  Lab 02/24/18 0522 02/25/18 0444  WBC 4.7 5.1  NEUTROABS 3.5 3.6  HGB 7.7* 9.5*  HCT 24.9* 29.9*  MCV 94.3 91.7  PLT 116* 124*

## 2018-02-27 NOTE — Progress Notes (Signed)
Bilateral lower extremity venous duplex has been completed. Negative for DVT.  02/27/18 2:19 PM Olen CordialGreg Kache Mcclurg RVT

## 2018-02-28 ENCOUNTER — Ambulatory Visit: Payer: Self-pay | Admitting: Family Medicine

## 2018-02-28 DIAGNOSIS — E669 Obesity, unspecified: Secondary | ICD-10-CM

## 2018-02-28 DIAGNOSIS — Z9989 Dependence on other enabling machines and devices: Secondary | ICD-10-CM

## 2018-02-28 DIAGNOSIS — G4733 Obstructive sleep apnea (adult) (pediatric): Secondary | ICD-10-CM

## 2018-02-28 DIAGNOSIS — E119 Type 2 diabetes mellitus without complications: Secondary | ICD-10-CM

## 2018-02-28 LAB — GLUCOSE, CAPILLARY
GLUCOSE-CAPILLARY: 231 mg/dL — AB (ref 70–99)
Glucose-Capillary: 172 mg/dL — ABNORMAL HIGH (ref 70–99)
Glucose-Capillary: 210 mg/dL — ABNORMAL HIGH (ref 70–99)
Glucose-Capillary: 189 mg/dL — ABNORMAL HIGH (ref 70–99)

## 2018-02-28 LAB — CBC WITH DIFFERENTIAL/PLATELET
Abs Immature Granulocytes: 0.06 10*3/uL (ref 0.00–0.07)
Basophils Absolute: 0.1 10*3/uL (ref 0.0–0.1)
Basophils Relative: 1 %
Eosinophils Absolute: 0.2 10*3/uL (ref 0.0–0.5)
Eosinophils Relative: 4 %
HCT: 32.7 % — ABNORMAL LOW (ref 36.0–46.0)
Hemoglobin: 10.2 g/dL — ABNORMAL LOW (ref 12.0–15.0)
Immature Granulocytes: 1 %
Lymphocytes Relative: 16 %
Lymphs Abs: 0.9 10*3/uL (ref 0.7–4.0)
MCH: 29.2 pg (ref 26.0–34.0)
MCHC: 31.2 g/dL (ref 30.0–36.0)
MCV: 93.7 fL (ref 80.0–100.0)
Monocytes Absolute: 0.5 10*3/uL (ref 0.1–1.0)
Monocytes Relative: 9 %
Neutro Abs: 4 10*3/uL (ref 1.7–7.7)
Neutrophils Relative %: 69 %
Platelets: 142 10*3/uL — ABNORMAL LOW (ref 150–400)
RBC: 3.49 MIL/uL — AB (ref 3.87–5.11)
RDW: 14.9 % (ref 11.5–15.5)
WBC: 5.8 10*3/uL (ref 4.0–10.5)
nRBC: 0 % (ref 0.0–0.2)

## 2018-02-28 LAB — BASIC METABOLIC PANEL
Anion gap: 12 (ref 5–15)
BUN: 77 mg/dL — ABNORMAL HIGH (ref 6–20)
CO2: 33 mmol/L — ABNORMAL HIGH (ref 22–32)
Calcium: 9.4 mg/dL (ref 8.9–10.3)
Chloride: 97 mmol/L — ABNORMAL LOW (ref 98–111)
Creatinine, Ser: 3.74 mg/dL — ABNORMAL HIGH (ref 0.44–1.00)
GFR calc Af Amer: 15 mL/min — ABNORMAL LOW (ref >60)
GFR calc non Af Amer: 13 mL/min — ABNORMAL LOW (ref >60)
Glucose, Bld: 163 mg/dL — ABNORMAL HIGH (ref 70–99)
Potassium: 4 mmol/L (ref 3.5–5.1)
Sodium: 142 mmol/L (ref 135–145)

## 2018-02-28 LAB — D-DIMER, QUANTITATIVE: D-Dimer, Quant: 1.23 ug/mL-FEU — ABNORMAL HIGH (ref 0.00–0.50)

## 2018-02-28 MED ORDER — ACETAZOLAMIDE 250 MG PO TABS
250.0000 mg | ORAL_TABLET | Freq: Two times a day (BID) | ORAL | Status: DC
  Administered 2018-02-28 – 2018-03-01 (×3): 250 mg via ORAL
  Filled 2018-02-28 (×3): qty 1

## 2018-02-28 MED ORDER — SODIUM CHLORIDE 0.9 % IV BOLUS: 250.0000 mL | Freq: Once | INTRAVENOUS | Status: DC

## 2018-02-28 NOTE — Progress Notes (Signed)
[facility] Progress Note  Subjective: off of O2 today, wants to walk in the halls. 2.7 L UOP yest.    Vitals:   02/27/18 2132 02/28/18 0500 02/28/18 0520 02/28/18 1242  BP:   (!) 157/71 140/66  Pulse: 87  82 79  Resp: 20  20 18   Temp:   98.5 F (36.9 C) 98.6 F (37 C)  TempSrc:   Oral Oral  SpO2: 97%  92% 100%  Weight:  91.4 kg    Height:        Inpatient medications: . acetaZOLAMIDE  250 mg Oral BID  . amLODipine  10 mg Oral Daily  . aspirin  81 mg Oral Daily  . atorvastatin  40 mg Oral q1800  . bisacodyl  10 mg Rectal Daily  . carvedilol  50 mg Oral BID WC  . heparin injection (subcutaneous)  5,000 Units Subcutaneous Q8H  . hydrALAZINE  50 mg Oral TID  . insulin aspart  0-5 Units Subcutaneous QHS  . insulin aspart  0-9 Units Subcutaneous TID WC  . insulin aspart  3 Units Subcutaneous TID WC  . levothyroxine  25 mcg Oral QAC breakfast  . metolazone  10 mg Oral Daily  . pantoprazole  40 mg Oral Daily  . polyethylene glycol  17 g Oral BID  . senna-docusate  1 tablet Oral BID   . sodium chloride 250 mL (02/27/18 1303)  . furosemide 160 mg (02/28/18 1152)   sodium chloride, acetaminophen **OR** acetaminophen, albuterol, hydrALAZINE, ondansetron **OR** ondansetron (ZOFRAN) IV  Iron/TIBC/Ferritin/ %Sat    Component Value Date/Time   IRON 30 02/19/2018 0915   TIBC 240 (L) 02/19/2018 0915   FERRITIN 86 02/19/2018 0915   IRONPCTSAT 13 02/19/2018 0915    Exam: Gen alert, facial edema  Sclera anicteric, throat clear  No jvd or bruits Chest clear bilat no rales or wheezing RRR no MRG Abd soft ntnd no mass or ascites +bs Ext mild-mod dependent hip and pretib edema Neuro is alert, Ox 3 , nf    Home meds:  - amlodipine 10/ carvedilol 50 bid/ furosemide 40 qd/ hydralazine 50 tid  - liraglutide 1.8mg  sq hs  - atorvastatin 40/ aspirin 81/ isosorbide mononitrate 30 qd  - pantoprazole 40/ potassium chloride 20 mwf/ sodium bicarb qd/ levothyroxine  25   Impression/ Plan:  Acute on CRF - baseline creat 2- 2.5, admitted w/ vol overload. With diuresis creat peaked 4.25, now improving 3.7 today.  Still diuresing, down about 10kg. When ambulating w/o O2 or symptoms can d/c home.  Cont diuretics for now.   DM2 - x 20 yrs  HTN - BP's stable, slightly high  HL  Diast CHF - EF 55%    Vinson Moselleob Ramar Nobrega MD [facility] pager [phone]   02/28/2018, 1:22 PM   Recent Labs  Lab 02/27/18 0456 02/28/18 0542  NA 142 142  K 3.7 4.0  CL 100 97*  CO2 31 33*  GLUCOSE 145* 163*  BUN 78* 77*  CREATININE 3.98* 3.74*  CALCIUM 9.2 9.4   No results for input(s): AST, ALT, ALKPHOS, BILITOT, PROT in the last 168 hours. Recent Labs  Lab 02/25/18 0444 02/28/18 0542  WBC 5.1 5.8  NEUTROABS 3.6 4.0  HGB 9.5* 10.2*  HCT 29.9* 32.7*  MCV 91.7 93.7  PLT 124* 142*

## 2018-02-28 NOTE — Progress Notes (Signed)
Physical Therapy Treatment Patient Details Name: Barbara Velazquez MRN: [id number] DOB: 08/14/1960 Today's Date: 02/28/2018    History of Present Illness Barbara Velazquez is a 57 y.o. year-old with hx of DM2 since 1998, HTN hard to control and CKD admitted for a/c diastolic CHF exacerbation and CKD.      PT Comments    Patient much improved with ability to walk hallway without seated rest, on RA with SpO2 90%.  Still SOB and noted with stair negotiation rebound drop once returned to room and seated, recovered in <1 minute with cues for pursed lip breathing.  Feel remains appropriate for home with family support and rollator walker for home.    Follow Up Recommendations  No PT follow up     Equipment Recommendations  Other (comment)(4 wheeled walker)    Recommendations for Other Services       Precautions / Restrictions Precautions Precautions: Fall    Mobility  Bed Mobility               General bed mobility comments: patient up in chair  Transfers Overall transfer level: Modified independent Equipment used: 4-wheeled walker Transfers: Sit to/from Stand Sit to Stand: Modified independent (Device/Increase time)         General transfer comment: appropriately pushes up from recliner  Ambulation/Gait Ambulation/Gait assistance: Supervision;Min guard Gait Distance (Feet): 300 Feet Assistive device: 4-wheeled walker Gait Pattern/deviations: Step-through pattern     General Gait Details: SpO2 90% on RA throughout hallway ambulation   Stairs Stairs: Yes Stairs assistance: Min guard;Supervision Stair Management: One rail Left;Sideways;Step to pattern Number of Stairs: 6 General stair comments: demonstrated step to pattern sideways for energy conservation, SpO2 after 89%, then once seated in chair dropped to 85%, but quickly back up with cues for PLB to 91%   Wheelchair Mobility    Modified Rankin (Stroke Patients Only)       Balance Overall balance  assessment: Mild deficits observed, not formally tested                                          Cognition Arousal/Alertness: Awake/alert Behavior During Therapy: WFL for tasks assessed/performed Overall Cognitive Status: Within Functional Limits for tasks assessed                                        Exercises      General Comments        Pertinent Vitals/Pain Pain Assessment: No/denies pain    Home Living                      Prior Function            PT Goals (current goals can now be found in the care plan section) Progress towards PT goals: Progressing toward goals    Frequency    Min 3X/week      PT Plan Current plan remains appropriate    Co-evaluation              AM-PAC PT "6 Clicks" Mobility   Outcome Measure  Help needed turning from your back to your side while in a flat bed without using bedrails?: None Help needed moving from lying on your back to sitting on the side of a flat bed without using  bedrails?: A Little Help needed moving to and from a bed to a chair (including a wheelchair)?: A Little Help needed standing up from a chair using your arms (e.g., wheelchair or bedside chair)?: None Help needed to walk in hospital room?: A Little Help needed climbing 3-5 steps with a railing? : A Little 6 Click Score: 20    End of Session Equipment Utilized During Treatment: Gait belt Activity Tolerance: Patient tolerated treatment well Patient left: with call bell/phone within reach;in chair   PT Visit Diagnosis: Muscle weakness (generalized) (M62.81);Difficulty in walking, not elsewhere classified (R26.2)     Time: 1350-1415 PT Time Calculation (min) (ACUTE ONLY): 25 min  Charges:  $Gait Training: 23-37 mins                     Sheran LawlessCyndi Rajan Burgard, [place]PT Acute Rehabilitation Services [phone] 02/28/2018    Barbara Velazquez Barbara Velazquez 02/28/2018, 4:07 PM

## 2018-02-28 NOTE — Progress Notes (Signed)
PROGRESS NOTE  Barbara Velazquez ZOX:[id number] DOB: 1960/08/23 DOA: 02/18/2018 PCP: Bing Neighbors, FNP  Brief Summary:  H/o dm2, OSA on cpap, ckd,  chronic heart failure, present with acute on chronic heart failure, likely noncompliance with medications and salt restriction.   Improving, nephrology managing diuretics, likely d/c later this week with nephrology clearance.   HPI/Recap of past 24 hours:   Continue to improve ,Urine output 2.7L last 24 hours,  Almost negative 15liters since admission, creatinine appears to  trend down  She denies of chest pain, no cough, edema is improving, hypoxia resolved  report still has dyspnea on exertion, but is improving No fever    Assessment/Plan: Active Problems:   Hypertension   DM (diabetes mellitus), type 2 with renal complications (HCC)   CKD (chronic kidney disease), stage IV (HCC)   Acute on chronic respiratory failure with hypoxia (HCC)   Acute on chronic diastolic CHF (congestive heart failure) (HCC)   Acute pulmonary edema (HCC)   AKI (acute kidney injury) (HCC)   ACute respiratory failure with hypoxia due to acute decompensated diastolic heart failure in the setting of acute pulmonary edema: Her est. dry weight around 96-97 kg back in October 2019 Iv lasix Linton Rump /diamoxPer nephrology Hypoxia resolved on 12/17, now weaned off oxygen, still has dyspnea, but improved   venous doppler bilateral lower extremity negative for DVT.  Essential hypertension: Currently on  Norvasc, Coreg, hydralazine, lasix, metolazone/diamox per nephrology,  Imdur and clonidine discontinued per nephrology recommendation Monitor blood pressure Nephrology input appreciated  Chronic thrombocytopenia: From liver congestion? No sign of bleeding plt nadir at 89,  Improving today at 142  AKI on CKDIII/IV -cr seems now peaked at 4.25, start to trend down -nephrology consulted, will follow recommendations   Anemia of chronic  disease: hgb stable at baseline around 9, hgb dropped to 7.6 with significant dyspnea on exertion, s/p one unit of blood on 12/13 hgb on 12/13, hgb  improved after prbc transfusion, dyspnea improved No sign of bleeding, FOBT negative  Insulin dependent dm2, controlled -a1c6.3 -was on insulin glargine 25unit qhs and victoza qhs at home I donot see insulin on home med list, will verify with patient -currently on ssi  HLD; on statin  Obesity:/OSA on cpap Body mass index is 31.54 kg/m.   Code Status: full  Family Communication: patient   Disposition Plan: Hopefully home with son  Mid to later this week, need nephrology clearance   Consultants:  nephrology  Procedures:  none  Antibiotics:  none   Objective: BP 140/66 (BP Location: Left Arm)   Pulse 79   Temp 98.6 F (37 C) (Oral)   Resp 18   Ht 5\' 7"  (1.702 m)   Wt 91.4 kg   SpO2 100%   BMI 31.54 kg/m   Intake/Output Summary (Last 24 hours) at 02/28/2018 1312 Last data filed at 02/28/2018 1117 Gross per 24 hour  Intake 727 ml  Output 3900 ml  Net -3173 ml   Filed Weights   02/26/18 0456 02/27/18 0515 02/28/18 0500  Weight: 99.8 kg 98.4 kg 91.4 kg    Exam: Patient is examined daily including today on 02/28/2018, exams remain the same as of yesterday except that has changed    General:  NAD  Cardiovascular: RRR  Respiratory: improved aeration , no wheezing, no rales, no rhonchi  Abdomen: Soft/ND/NT, positive BS  Musculoskeletal: Bilateral lower extremity pitting edema is improving  Neuro: alert, oriented   Data Reviewed: Basic Metabolic Panel: Recent Labs  Lab 02/24/18 0522 02/25/18 0444 02/26/18 0519 02/27/18 0456 02/28/18 0542  NA 141 138 143 142 142  K 4.1 4.1 4.3 3.7 4.0  CL 105 103 102 100 97*  CO2 25 25 30 31  33*  GLUCOSE 184* 175* 148* 145* 163*  BUN 68* 72* 78* 78* 77*  CREATININE 4.25* 4.09* 3.91* 3.98* 3.74*  CALCIUM 8.4* 8.9 9.3 9.2 9.4   Liver Function Tests: No  results for input(s): AST, ALT, ALKPHOS, BILITOT, PROT, ALBUMIN in the last 168 hours. No results for input(s): LIPASE, AMYLASE in the last 168 hours. No results for input(s): AMMONIA in the last 168 hours. CBC: Recent Labs  Lab 02/23/18 0517 02/24/18 0522 02/25/18 0444 02/28/18 0542  WBC 4.6 4.7 5.1 5.8  NEUTROABS 3.1 3.5 3.6 4.0  HGB 7.6* 7.7* 9.5* 10.2*  HCT 24.7* 24.9* 29.9* 32.7*  MCV 93.2 94.3 91.7 93.7  PLT 106* 116* 124* 142*   Cardiac Enzymes:   No results for input(s): CKTOTAL, CKMB, CKMBINDEX, TROPONINI in the last 168 hours. BNP (last 3 results) Recent Labs    12/21/17 1730 02/18/18 1207 02/26/18 0519  BNP 1,439.6* 920.0* 525.1*    ProBNP (last 3 results) No results for input(s): PROBNP in the last 8760 hours.  CBG: Recent Labs  Lab 02/27/18 1218 02/27/18 1731 02/27/18 2059 02/28/18 0735 02/28/18 1138  GLUCAP 173* 197* 136* 172* 210*    No results found for this or any previous visit (from the past 240 hour(s)).   Studies: Vas [place] Lower Extremity Venous (dvt)  Result Date: 02/27/2018  Lower Venous Study Indications: Edema.  Performing Technologist: Chanda Busing RVT  Examination Guidelines: A complete evaluation includes B-mode imaging, spectral Doppler, color Doppler, and power Doppler as needed of all accessible portions of each vessel. Bilateral testing is considered an integral part of a complete examination. Limited examinations for reoccurring indications may be performed as noted.  Right Venous Findings: +---------+---------------+---------+-----------+----------+-------+          CompressibilityPhasicitySpontaneityPropertiesSummary +---------+---------------+---------+-----------+----------+-------+ CFV      Full           Yes      Yes                          +---------+---------------+---------+-----------+----------+-------+ SFJ      Full                                                  +---------+---------------+---------+-----------+----------+-------+ FV Prox  Full                                                 +---------+---------------+---------+-----------+----------+-------+ FV Mid   Full                                                 +---------+---------------+---------+-----------+----------+-------+ FV DistalFull                                                 +---------+---------------+---------+-----------+----------+-------+  PFV      Full                                                 +---------+---------------+---------+-----------+----------+-------+ POP      Full           Yes      Yes                          +---------+---------------+---------+-----------+----------+-------+ PTV      Full                                                 +---------+---------------+---------+-----------+----------+-------+ PERO     Full                                                 +---------+---------------+---------+-----------+----------+-------+  Left Venous Findings: +---------+---------------+---------+-----------+----------+-------+          CompressibilityPhasicitySpontaneityPropertiesSummary +---------+---------------+---------+-----------+----------+-------+ CFV      Full           Yes                                   +---------+---------------+---------+-----------+----------+-------+ SFJ      Full                                                 +---------+---------------+---------+-----------+----------+-------+ FV Prox  Full                                                 +---------+---------------+---------+-----------+----------+-------+ FV Mid   Full                                                 +---------+---------------+---------+-----------+----------+-------+ FV DistalFull                                                  +---------+---------------+---------+-----------+----------+-------+ PFV      Full                                                 +---------+---------------+---------+-----------+----------+-------+ POP      Full           Yes                                   +---------+---------------+---------+-----------+----------+-------+  PTV      Full                                                 +---------+---------------+---------+-----------+----------+-------+ PERO     Full                                                 +---------+---------------+---------+-----------+----------+-------+    Summary: Right: There is no evidence of deep vein thrombosis in the lower extremity. No cystic structure found in the popliteal fossa. Left: There is no evidence of deep vein thrombosis in the lower extremity. No cystic structure found in the popliteal fossa.  *See table(s) above for measurements and observations. Electronically signed by Sherald Hesshristopher Clark MD on 02/27/2018 at 6:00:21 PM.    Final     Scheduled Meds: . acetaZOLAMIDE  250 mg Oral BID  . amLODipine  10 mg Oral Daily  . aspirin  81 mg Oral Daily  . atorvastatin  40 mg Oral q1800  . bisacodyl  10 mg Rectal Daily  . carvedilol  50 mg Oral BID WC  . heparin injection (subcutaneous)  5,000 Units Subcutaneous Q8H  . hydrALAZINE  50 mg Oral TID  . insulin aspart  0-5 Units Subcutaneous QHS  . insulin aspart  0-9 Units Subcutaneous TID WC  . insulin aspart  3 Units Subcutaneous TID WC  . levothyroxine  25 mcg Oral QAC breakfast  . metolazone  10 mg Oral Daily  . pantoprazole  40 mg Oral Daily  . polyethylene glycol  17 g Oral BID  . senna-docusate  1 tablet Oral BID    Continuous Infusions: . sodium chloride 250 mL (02/27/18 1303)  . furosemide 160 mg (02/28/18 1152)     Time spent: 25 mins,  I have personally reviewed and interpreted on  02/28/2018 daily labs, imagings as discussed above under date review session and  assessment and plans.  I reviewed all nursing notes, pharmacy notes, consultant notes,  vitals, pertinent old records  I have discussed plan of care as described above with RN , patient  on 02/28/2018   Albertine GratesFang Rylann Munford MD, PhD  [facility] Pager [phone]. If 7PM-7AM, please contact night-coverage at [email or web address], password [email or web address] 02/28/2018, 1:12 PM  LOS: 10 days

## 2018-03-01 DIAGNOSIS — I5020 Unspecified systolic (congestive) heart failure: Secondary | ICD-10-CM

## 2018-03-01 LAB — BASIC METABOLIC PANEL
Anion gap: 13 (ref 5–15)
BUN: 89 mg/dL — ABNORMAL HIGH (ref 6–20)
CO2: 35 mmol/L — AB (ref 22–32)
Calcium: 9.4 mg/dL (ref 8.9–10.3)
Chloride: 93 mmol/L — ABNORMAL LOW (ref 98–111)
Creatinine, Ser: 3.86 mg/dL — ABNORMAL HIGH (ref 0.44–1.00)
GFR calc Af Amer: 14 mL/min — ABNORMAL LOW (ref >60)
GFR calc non Af Amer: 12 mL/min — ABNORMAL LOW (ref >60)
Glucose, Bld: 165 mg/dL — ABNORMAL HIGH (ref 70–99)
Potassium: 3.5 mmol/L (ref 3.5–5.1)
Sodium: 141 mmol/L (ref 135–145)

## 2018-03-01 LAB — GLUCOSE, CAPILLARY
Glucose-Capillary: 189 mg/dL — ABNORMAL HIGH (ref 70–99)
Glucose-Capillary: 217 mg/dL — ABNORMAL HIGH (ref 70–99)

## 2018-03-01 MED ORDER — FUROSEMIDE 40 MG PO TABS: 160.0000 mg | ORAL_TABLET | Freq: Two times a day (BID) | ORAL | 0 refills | Status: AC

## 2018-03-01 MED ORDER — POLYETHYLENE GLYCOL 3350 17 G PO PACK: 17.0000 g | PACK | Freq: Every day | ORAL | Status: DC

## 2018-03-01 MED ORDER — METOLAZONE 10 MG PO TABS: 5.0000 mg | ORAL_TABLET | Freq: Every day | ORAL | 0 refills | Status: AC

## 2018-03-01 MED ORDER — POTASSIUM CHLORIDE CRYS ER 20 MEQ PO TBCR
20.0000 meq | EXTENDED_RELEASE_TABLET | Freq: Once | ORAL | Status: AC
  Administered 2018-03-01: 20 meq via ORAL
  Filled 2018-03-01: qty 1

## 2018-03-01 MED ORDER — ACETAZOLAMIDE 250 MG PO TABS: 250.0000 mg | ORAL_TABLET | Freq: Two times a day (BID) | ORAL | 0 refills | Status: AC

## 2018-03-01 MED FILL — acetaZOLAMIDE 250 MG TABS: 250 | 5 days supply | Qty: 10 | Fill #0

## 2018-03-01 MED FILL — metOLazone 5 MG TABS: 5 | 30 days supply | Qty: 30 | Fill #0

## 2018-03-01 MED FILL — FUROSEMIDE 40 MG TAB: 40 | 30 days supply | Qty: 240 | Fill #0

## 2018-03-01 NOTE — Progress Notes (Signed)
[facility] Progress Note  Subjective: wt's down to 89kg today, down from 115 kg on admission.  Walking halls w/ o SOB or hypoxemia.  Ready for dc.   Vitals:   02/28/18 1242 02/28/18 2100 02/28/18 2137 03/01/18 0430  BP: 140/66  126/60 (!) 152/70  Pulse: 79 74 81 89  Resp: 18 16 18 18   Temp: 98.6 F (37 C)  99.2 F (37.3 C) 98 F (36.7 C)  TempSrc: Oral  Oral Oral  SpO2: 100% 96% 97% 90%  Weight:    89.3 kg  Height:        Inpatient medications: . acetaZOLAMIDE  250 mg Oral BID  . amLODipine  10 mg Oral Daily  . aspirin  81 mg Oral Daily  . atorvastatin  40 mg Oral q1800  . carvedilol  50 mg Oral BID WC  . heparin injection (subcutaneous)  5,000 Units Subcutaneous Q8H  . hydrALAZINE  50 mg Oral TID  . insulin aspart  0-5 Units Subcutaneous QHS  . insulin aspart  0-9 Units Subcutaneous TID WC  . insulin aspart  3 Units Subcutaneous TID WC  . levothyroxine  25 mcg Oral QAC breakfast  . metolazone  10 mg Oral Daily  . pantoprazole  40 mg Oral Daily  . [START ON 03/02/2018] polyethylene glycol  17 g Oral Daily  . senna-docusate  1 tablet Oral BID   . sodium chloride 5 mL/hr at 03/01/18 0620  . furosemide 160 mg (03/01/18 1151)   sodium chloride, acetaminophen **OR** acetaminophen, albuterol, hydrALAZINE, ondansetron **OR** ondansetron (ZOFRAN) IV  Iron/TIBC/Ferritin/ %Sat    Component Value Date/Time   IRON 30 02/19/2018 0915   TIBC 240 (L) 02/19/2018 0915   FERRITIN 86 02/19/2018 0915   IRONPCTSAT 13 02/19/2018 0915    Exam: Gen alert, facial edema  Sclera anicteric, throat clear  No jvd or bruits Chest clear bilat no rales or wheezing RRR no MRG Abd soft ntnd no mass or ascites +bs Ext mild-mod dependent hip and pretib edema Neuro is alert, Ox 3 , nf    Home meds:  - amlodipine 10/ carvedilol 50 bid/ furosemide 40 qd/ hydralazine 50 tid  - liraglutide 1.8mg  sq hs  - atorvastatin 40/ aspirin 81/ isosorbide mononitrate 30 qd  -  pantoprazole 40/ potassium chloride 20 mwf/ sodium bicarb qd/ levothyroxine 25   Impression/ Plan:  Acute on CRF - baseline creat 2- 2.5, admitted w/ vol overload. With diuresis creat peaked 4.25, now in the high 3's , 3.8 today, CKD stage IV.  Wt's are down 16kg from admission.  Likely this is a new baseline.  Will have f/u new visit Jan 23rd w/ Dr Marisue Humble.  In the meantime if she has any problems she can call our office.  OK for dc today on lasix 160 bid and zaroxolyn 5 mg / day. She should [facility] office if her wt's increase or decrease by 5 lbs.   DM2 - x 20 yrs  HTN - BP's stable, slightly high  HL  Diast CHF - EF 55%   Vinson Moselle MD [facility] pager [phone]   03/01/2018, 12:37 PM   Recent Labs  Lab 02/28/18 0542 03/01/18 0453  NA 142 141  K 4.0 3.5  CL 97* 93*  CO2 33* 35*  GLUCOSE 163* 165*  BUN 77* 89*  CREATININE 3.74* 3.86*  CALCIUM 9.4 9.4   No results for input(s): AST, ALT, ALKPHOS, BILITOT, PROT in the last 168 hours. Recent Labs  Lab 02/25/18 0444 02/28/18 0542  WBC 5.1 5.8  NEUTROABS 3.6 4.0  HGB 9.5* 10.2*  HCT 29.9* 32.7*  MCV 91.7 93.7  PLT 124* 142*

## 2018-03-01 NOTE — Discharge Summary (Signed)
Physician Discharge Summary  Barbara Velazquez AVW:[id number] DOB: 1960-04-29 DOA: 02/18/2018  PCP: Bing Neighbors, FNP  Admit date: 02/18/2018 Discharge date: 03/01/2018  Admitted From: Home  Disposition: Home   Recommendations for Outpatient Follow-up:  1. Follow up with PCP in 1-2 weeks 2. Please obtain BMP/CBC in one week 3. Monitor weight.  4. Monitor renal function an electrolytes.   Home Health: none  Discharge Condition: stable.  CODE STATUS: full code.  Diet recommendation: Heart Healthy  Brief/Interim Summary: 57 year old with past medical h/o dm2, OSA on cpap, ckd,  chronic heart failure, present with acute on chronic heart failure, likely noncompliance with medications and salt restriction.    Patient was a started on IV Lasix, and oral metolazone.  Her weight has significantly decreased from 2 16-1 96 the day of discharge.  She will be discharged on oral Lasix 160 mg twice daily.  Nephrology was helping with patient care.  Patient has been cleared to be discharged today by the nephrologist.     Assessment/Plan: Active Problems:   Hypertension   DM (diabetes mellitus), type 2 with renal complications (HCC)   CKD (chronic kidney disease), stage IV (HCC)   Acute on chronic respiratory failure with hypoxia (HCC)   Acute on chronic diastolic CHF (congestive heart failure) (HCC)   Acute pulmonary edema (HCC)   AKI (acute kidney injury) (HCC)   ACute respiratory failure with hypoxia due to acute decompensated diastolic heart failure in the setting of acute pulmonary edema: Her est. dry weight around 96-97 kgback in October 2019 Iv lasix L[place] Rump /diamoxPer nephrology Hypoxia resolved on 12/17, now weaned off oxygen, still has dyspnea, but improved venous doppler bilateral lower extremity negative for DVT. Patient is -15 L.  She was cleared to be discharged by nephrologist today. Be discharged on 160 mg of Lasix twice daily.  She will need close follow-up with  nephrologist.  Essential hypertension: Currently on  Norvasc, Coreg, hydralazine, lasix, metolazone/diamox per nephrology,  Imdur and clonidine discontinued per nephrology recommendation Monitor blood pressure Nephrology input appreciated  Chronic thrombocytopenia: From liver congestion? No sign of bleeding plt nadir at 89,  Improving today at 142  AKI on CKDIII/IV -cr seems now peaked at 4.25, start to trend down -nephrology consulted, will follow recommendations -Creatinine trending down.  She will need close follow-up with nephrologist.  Anemia of chronic disease: hgb stable at baseline around 9, hgb dropped to 7.6 with significant dyspnea on exertion, s/p one unit of blood on 12/13 hgb on 12/13, hgb  improved after prbc transfusion, dyspnea improved No sign of bleeding, FOBT negative  Insulin dependent dm2, controlled -a1c6.3 -resume victoza/  -currently on ssi  HLD; on statin  Obesity:/OSA on cpap Body mass index is 31.54 kg/m.  Discharge Diagnoses:  Active Problems:   Hypertension   Diabetes mellitus type 2, noninsulin dependent (HCC)   CKD (chronic kidney disease), stage IV (HCC)   Acute on chronic respiratory failure with hypoxia (HCC)   Acute on chronic diastolic CHF (congestive heart failure) (HCC)   Acute pulmonary edema (HCC)   AKI (acute kidney injury) (HCC)   Obesity (BMI 30-39.9)   OSA on CPAP    Discharge Instructions  Discharge Instructions    Diet - low sodium heart healthy   Complete by:  As directed    Increase activity slowly   Complete by:  As directed      Allergies as of 03/01/2018   No Known Allergies     Medication List  STOP taking these medications   potassium chloride SA 20 MEQ tablet Commonly known as:  K-DUR,KLOR-CON   sodium bicarbonate 650 MG tablet     TAKE these medications   acetaZOLAMIDE 250 MG tablet Commonly known as:  DIAMOX Take 1 tablet (250 mg total) by mouth 2 (two) times daily.    albuterol 108 (90 Base) MCG/ACT inhaler Commonly known as:  PROVENTIL HFA;VENTOLIN HFA Inhale 2 puffs into the lungs every 6 (six) hours as needed for wheezing or shortness of breath.   amLODipine 10 MG tablet Commonly known as:  NORVASC Take 1 tablet (10 mg total) by mouth daily.   aspirin 81 MG chewable tablet Chew 81 mg by mouth daily.   atorvastatin 40 MG tablet Commonly known as:  LIPITOR Take 1 tablet (40 mg total) by mouth daily.   carvedilol 25 MG tablet Commonly known as:  COREG Take 2 tablets (50 mg total) by mouth 2 (two) times daily with a meal.   dicyclomine 20 MG tablet Commonly known as:  BENTYL Take 20 mg by mouth as needed for spasms (diarrhea).   furosemide 40 MG tablet Commonly known as:  LASIX Take 4 tablets (160 mg total) by mouth 2 (two) times daily. What changed:    how much to take  when to take this   hydrALAZINE 50 MG tablet Commonly known as:  APRESOLINE Take 1 tablet (50 mg total) by mouth 3 (three) times daily.   isosorbide mononitrate 30 MG 24 hr tablet Commonly known as:  IMDUR Take 1 tablet (30 mg total) by mouth daily.   levothyroxine 25 MCG tablet Commonly known as:  SYNTHROID, LEVOTHROID Take 25 mcg by mouth daily before breakfast.   liraglutide 18 MG/3ML Sopn Commonly known as:  VICTOZA Inject 0.3 mLs (1.8 mg total) into the skin every evening.   metolazone 10 MG tablet Commonly known as:  ZAROXOLYN Take 0.5 tablets (5 mg total) by mouth daily. Start taking on:  March 02, 2018   pantoprazole 40 MG tablet Commonly known as:  PROTONIX Take 1 tablet (40 mg total) by mouth daily.      Follow-up Information    [facility]. Go on 02/28/2018.   Why:  at 1:50pm for an appointment with Joaquin Courts, FNP.   Contact information: [address], [address] [place] [address]       Arita Miss, MD Follow up on 04/06/2018.   Specialty:  Nephrology Why:  Kidney doctor appt with Dr  Marisue Humble  for Thursday Apr 06, 2018 please arrive at 10:30 am Contact information: [address] [place] [address] [phone]          No Known Allergies  Consultations:  Nephrology    Procedures/Studies: Dg Chest 2 View  Result Date: 02/22/2018 CLINICAL DATA:  Follow-up edema EXAM: CHEST - 2 VIEW COMPARISON:  02/18/2018 FINDINGS: Cardiac shadow remains enlarged. Central vascular congestion is again identified. Small posterior effusions are noted slightly improved when compared with the prior study. No new focal infiltrate is seen. No bony abnormality is noted. IMPRESSION: Stable vascular congestion with small pleural effusions. Electronically Signed   By: Alcide Clever M.D.   On: 02/22/2018 11:46   Dg Chest 2 View  Result Date: 02/18/2018 CLINICAL DATA:  Progressively worsening shortness of breath over the past month. EXAM: CHEST - 2 VIEW COMPARISON:  Chest x-ray dated December 21, 2017. FINDINGS: Stable mild cardiomegaly. Increased pulmonary vascular congestion and small bilateral pleural effusions. Left greater than  right bibasilar atelectasis. No pneumothorax. No acute osseous abnormality. IMPRESSION: Worsening mild congestive heart failure. Electronically Signed   By: Obie Dredge M.D.   On: 02/18/2018 12:05   US Renal  Result Date: 02/22/2018 CLINICAL DATA:  Elevated serum creatinine. History of diabetes, hypertension, and CHF. EXAM: RENAL / URINARY TRACT ULTRASOUND COMPLETE COMPARISON:  None. FINDINGS: Right Kidney: Renal measurements: 11.0 x 4.6 x 4.6 cm = volume: 125 mL. The renal cortical echotexture is increased and exceeds that of the liver. There is no focal mass nor hydronephrosis. Left Kidney: Renal measurements: 12.1 x 5.9 x 5.8 cm = volume: 217 mL. The cortical echotexture of the left kidney is increased similar to that on the right. There is no focal mass or hydronephrosis. Bladder: Appears normal for degree of bladder distention. Bilateral pleural effusions are  observed. IMPRESSION: Increased renal cortical echotexture compatible with medical renal disease. No hydronephrosis. Bilateral pleural effusions are noted. Electronically Signed   By: David  [place] M.D.   On: 02/22/2018 11:57   Vas [place] Lower Extremity Venous (dvt)  Result Date: 02/27/2018  Lower Venous Study Indications: Edema.  Performing Technologist: Chanda Busing RVT  Examination Guidelines: A complete evaluation includes B-mode imaging, spectral Doppler, color Doppler, and power Doppler as needed of all accessible portions of each vessel. Bilateral testing is considered an integral part of a complete examination. Limited examinations for reoccurring indications may be performed as noted.  Right Venous Findings: +---------+---------------+---------+-----------+----------+-------+          CompressibilityPhasicitySpontaneityPropertiesSummary +---------+---------------+---------+-----------+----------+-------+ CFV      Full           Yes      Yes                          +---------+---------------+---------+-----------+----------+-------+ SFJ      Full                                                 +---------+---------------+---------+-----------+----------+-------+ FV Prox  Full                                                 +---------+---------------+---------+-----------+----------+-------+ FV Mid   Full                                                 +---------+---------------+---------+-----------+----------+-------+ FV DistalFull                                                 +---------+---------------+---------+-----------+----------+-------+ PFV      Full                                                 +---------+---------------+---------+-----------+----------+-------+ POP      Full           Yes  Yes                          +---------+---------------+---------+-----------+----------+-------+ PTV      Full                                                  +---------+---------------+---------+-----------+----------+-------+ PERO     Full                                                 +---------+---------------+---------+-----------+----------+-------+  Left Venous Findings: +---------+---------------+---------+-----------+----------+-------+          CompressibilityPhasicitySpontaneityPropertiesSummary +---------+---------------+---------+-----------+----------+-------+ CFV      Full           Yes                                   +---------+---------------+---------+-----------+----------+-------+ SFJ      Full                                                 +---------+---------------+---------+-----------+----------+-------+ FV Prox  Full                                                 +---------+---------------+---------+-----------+----------+-------+ FV Mid   Full                                                 +---------+---------------+---------+-----------+----------+-------+ FV DistalFull                                                 +---------+---------------+---------+-----------+----------+-------+ PFV      Full                                                 +---------+---------------+---------+-----------+----------+-------+ POP      Full           Yes                                   +---------+---------------+---------+-----------+----------+-------+ PTV      Full                                                 +---------+---------------+---------+-----------+----------+-------+ PERO     Full                                                 +---------+---------------+---------+-----------+----------+-------+  Summary: Right: There is no evidence of deep vein thrombosis in the lower extremity. No cystic structure found in the popliteal fossa. Left: There is no evidence of deep vein thrombosis in the lower extremity. No cystic structure found in the  popliteal fossa.  *See table(s) above for measurements and observations. Electronically signed by Sherald Hess MD on 02/27/2018 at 6:00:21 PM.    Final       Subjective: Breathing better   Discharge Exam: Vitals:   02/28/18 2137 03/01/18 0430  BP: 126/60 (!) 152/70  Pulse: 81 89  Resp: 18 18  Temp: 99.2 F (37.3 C) 98 F (36.7 C)  SpO2: 97% 90%   Vitals:   02/28/18 1242 02/28/18 2100 02/28/18 2137 03/01/18 0430  BP: 140/66  126/60 (!) 152/70  Pulse: 79 74 81 89  Resp: 18 16 18 18   Temp: 98.6 F (37 C)  99.2 F (37.3 C) 98 F (36.7 C)  TempSrc: Oral  Oral Oral  SpO2: 100% 96% 97% 90%  Weight:    89.3 kg  Height:        General: Pt is alert, awake, not in acute distress Cardiovascular: RRR, S1/S2 +, no rubs, no gallops Respiratory: CTA bilaterally, no wheezing, no rhonchi Abdominal: Soft, NT, ND, bowel sounds + Extremities: no edema, no cyanosis    The results of significant diagnostics from this hospitalization (including imaging, microbiology, ancillary and laboratory) are listed below for reference.     Microbiology: No results found for this or any previous visit (from the past 240 hour(s)).   Labs: BNP (last 3 results) Recent Labs    12/21/17 1730 02/18/18 1207 02/26/18 0519  BNP 1,439.6* 920.0* 525.1*   Basic Metabolic Panel: Recent Labs  Lab 02/25/18 0444 02/26/18 0519 02/27/18 0456 02/28/18 0542 03/01/18 0453  NA 138 143 142 142 141  K 4.1 4.3 3.7 4.0 3.5  CL 103 102 100 97* 93*  CO2 25 30 31  33* 35*  GLUCOSE 175* 148* 145* 163* 165*  BUN 72* 78* 78* 77* 89*  CREATININE 4.09* 3.91* 3.98* 3.74* 3.86*  CALCIUM 8.9 9.3 9.2 9.4 9.4   Liver Function Tests: No results for input(s): AST, ALT, ALKPHOS, BILITOT, PROT, ALBUMIN in the last 168 hours. No results for input(s): LIPASE, AMYLASE in the last 168 hours. No results for input(s): AMMONIA in the last 168 hours. CBC: Recent Labs  Lab 02/23/18 0517 02/24/18 0522 02/25/18 0444  02/28/18 0542  WBC 4.6 4.7 5.1 5.8  NEUTROABS 3.1 3.5 3.6 4.0  HGB 7.6* 7.7* 9.5* 10.2*  HCT 24.7* 24.9* 29.9* 32.7*  MCV 93.2 94.3 91.7 93.7  PLT 106* 116* 124* 142*   Cardiac Enzymes: No results for input(s): CKTOTAL, CKMB, CKMBINDEX, TROPONINI in the last 168 hours. BNP: Invalid input(s): POCBNP CBG: Recent Labs  Lab 02/28/18 0735 02/28/18 1138 02/28/18 1613 02/28/18 2131 03/01/18 1113  GLUCAP 172* 210* 189* 231* 217*   D-Dimer Recent Labs    02/28/18 0542  DDIMER 1.23*   Hgb A1c No results for input(s): HGBA1C in the last 72 hours. Lipid Profile No results for input(s): CHOL, HDL, LDLCALC, TRIG, CHOLHDL, LDLDIRECT in the last 72 hours. Thyroid function studies No results for input(s): TSH, T4TOTAL, T3FREE, THYROIDAB in the last 72 hours.  Invalid input(s): FREET3 Anemia work up No results for input(s): VITAMINB12, FOLATE, FERRITIN, TIBC, IRON, RETICCTPCT in the last 72 hours. Urinalysis    Component Value Date/Time   COLORURINE YELLOW 01/12/2018 2236   APPEARANCEUR CLEAR 01/12/2018 2236  LABSPEC 1.017 01/12/2018 2236   PHURINE 6.0 01/12/2018 2236   GLUCOSEU 150 (A) 01/12/2018 2236   HGBUR NEGATIVE 01/12/2018 2236   BILIRUBINUR NEGATIVE 01/12/2018 2236   KETONESUR NEGATIVE 01/12/2018 2236   PROTEINUR >=300 (A) 01/12/2018 2236   NITRITE NEGATIVE 01/12/2018 2236   LEUKOCYTESUR NEGATIVE 01/12/2018 2236   Sepsis Labs Invalid input(s): PROCALCITONIN,  WBC,  LACTICIDVEN Microbiology No results found for this or any previous visit (from the past 240 hour(s)).   Time coordinating discharge: 35 minutes.   SIGNED:   Alba CoryBelkys A Ireoluwa Gorsline, MD  [facility] 03/01/2018, 12:59 PM Pager   If 7PM-7AM, please contact night-coverage [email or web address] Password [email or web address]

## 2018-03-02 ENCOUNTER — Telehealth: Payer: Self-pay

## 2018-03-02 ENCOUNTER — Other Ambulatory Visit: Payer: Self-pay | Admitting: Family Medicine

## 2018-03-02 MED FILL — ATORVASTATIN 40 MG TABLET: 40 | 30 days supply | Qty: 30 | Fill #1

## 2018-03-02 MED FILL — AMLODIPINE BESYLATE 10 MG T: 10 | 30 days supply | Qty: 30 | Fill #1

## 2018-03-02 MED FILL — POTASSIUM CL ER 20 MEQ TAB: 20 | 28 days supply | Qty: 12 | Fill #1

## 2018-03-02 MED FILL — PANTOPRAZOLE SOD DR 40 MG T: 40 | 30 days supply | Qty: 30 | Fill #1

## 2018-03-02 MED FILL — hydrALAZINE HCL 50 MG TABS: 50 | 30 days supply | Qty: 90 | Fill #1

## 2018-03-02 MED FILL — ISOSORBIDE MN ER 30 MG TAB: 30 | 30 days supply | Qty: 30 | Fill #1

## 2018-03-02 MED FILL — FUROSEMIDE 40 MG TAB: 40 | 30 days supply | Qty: 30 | Fill #1

## 2018-03-02 NOTE — Telephone Encounter (Signed)
Transition Care Management Follow-up Telephone Call  Call completed with patient.  There were not any appointments available within the 14 day transitional care appointment time frame.    Date of discharge and from where: [facility]   How have you been since you were released from the hospital? She said that she is feeling  "okay."   Any questions or concerns? No questions /concerns reported at this time.   Items Reviewed:  Did the pt receive and understand the discharge instructions provided?  She said that she has the instructions and medication list  and will need to review again.  No questions at this time.   Medications obtained and verified? She said that she still has not picked up her new medications - metolazone and diamox.  She explained that [facility] to see if the medications are ready for pick up and she will plan to get them today. this CM offered to review the medication list with her and she said that she needs to review the list and determine what else she needs to have refilled first.  She did not have any questions at this time.. This CM explained that when reviewing the list, she needs to pay attention to the change with the lasix and note the medications that are to be stopped - potassium chloride and sodium bicarbonate. She stated that she understood.   Any new allergies since your discharge?   none reported   Do you have support at home? She said that she is currently living with her son.   Other (ie: DME, Home Health, etc) home health was not ordered.  She said that she needs a scale and will need to get one with numbers big enough that she can read. She does not have a glucometer to check blood sugars and again noted that if she needs one, she has to be able to read the numbers on the machine.   She said that they mentioned that she would get a RW at discharge but she did not leave the hospital with one. She also has  concerns about getting the RW up and down the stairs in her son's home.  Functional Questionnaire: (I = Independent and D = Dependent) ADL's: independent    Follow up appointments reviewed:    PCP Hospital f/u appt confirmed? [facility]21[facility]2018 [facility]10 [facility]  She missed her last scheduled appointment because she was still hospitalized.  Specialist Hospital f/u appt confirmed? Nephrology with Dr [facility] - 04/06/2018 @ 1030.  The patient stated that she has the information on her discharge papers  Are transportation arrangements needed?   she said that she may need to take [employer].  If their condition worsens, is the pt aware to call  their PCP or go to the ED? yes  Was the patient provided with contact information for the PCP's office or ED? She has the phone #.   Was the pt encouraged to call back with questions or concerns? yes

## 2018-03-02 NOTE — Telephone Encounter (Signed)
Patient of [facility]- sent for provider review of Rx request

## 2018-03-03 MED FILL — CARVEDILOL 25 MG TABLET: 25 | 30 days supply | Qty: 120 | Fill #0

## 2018-03-21 ENCOUNTER — Ambulatory Visit: Payer: Self-pay | Admitting: Family Medicine

## 2020-08-09 IMAGING — US US RENAL
1 series · 14 of 25 positions shown · non-contrast
Comparison: None.

CLINICAL DATA: Elevated serum creatinine. History of diabetes,
hypertension, and CHF.

EXAM:
RENAL / URINARY TRACT ULTRASOUND COMPLETE

[Series 1: us renal · 14 of 43 slices shown]
[im 1/43]
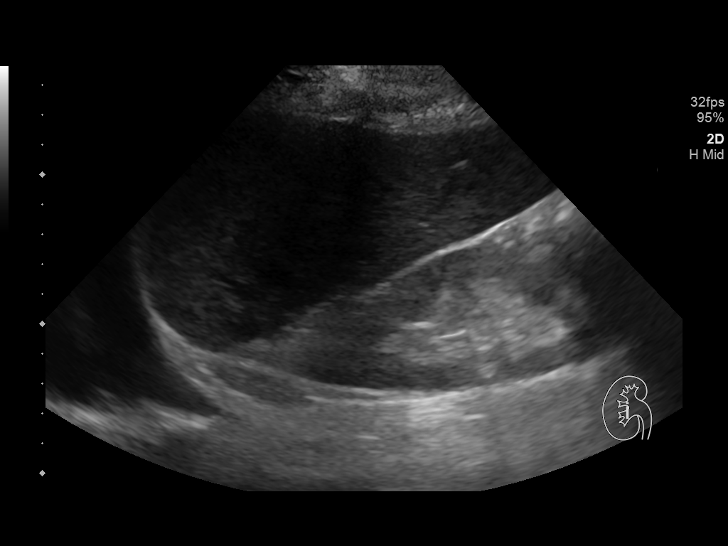
[im 4/43]
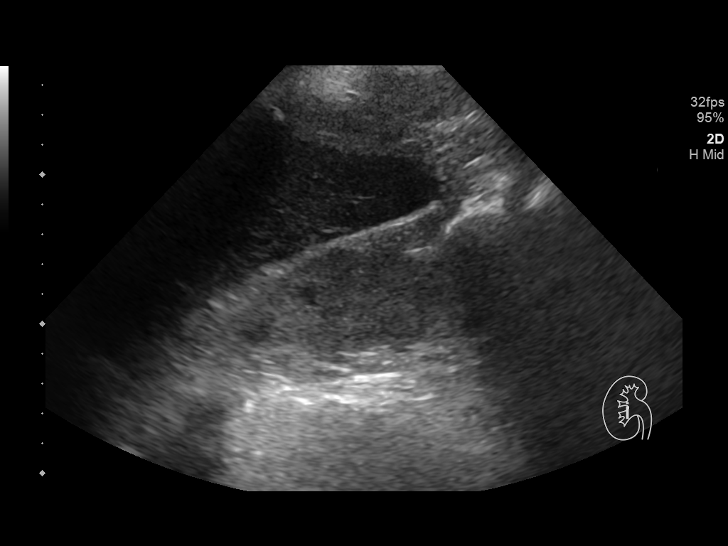
[im 8/43]
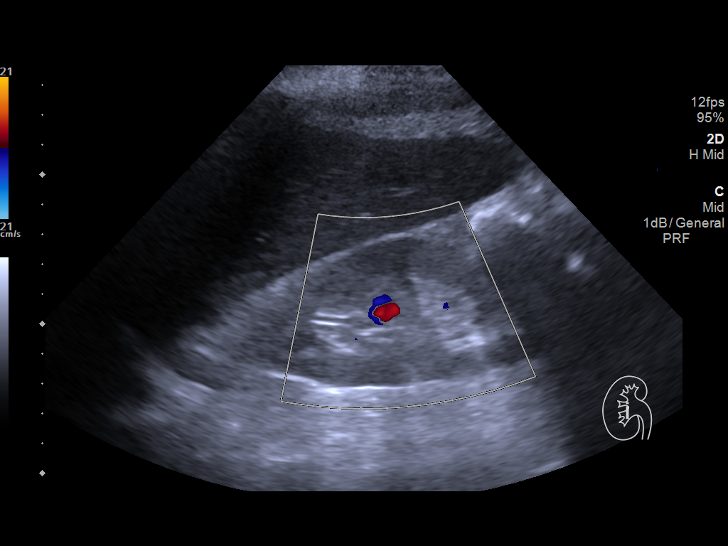
[im 11/43]
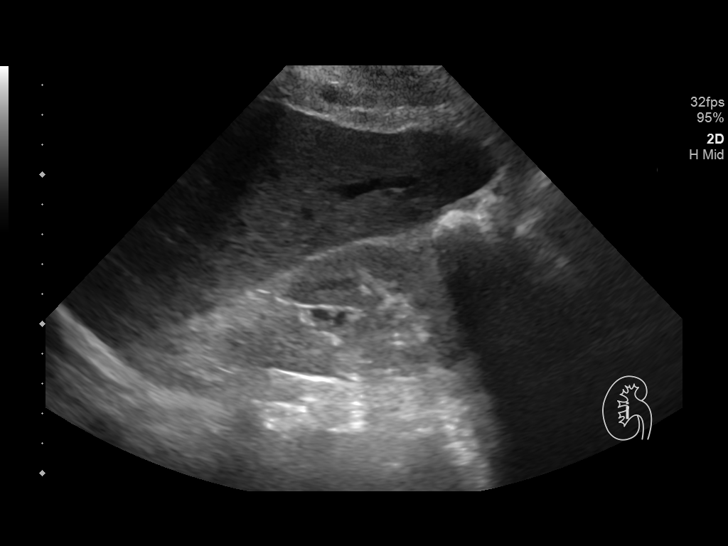
[im 15/43]
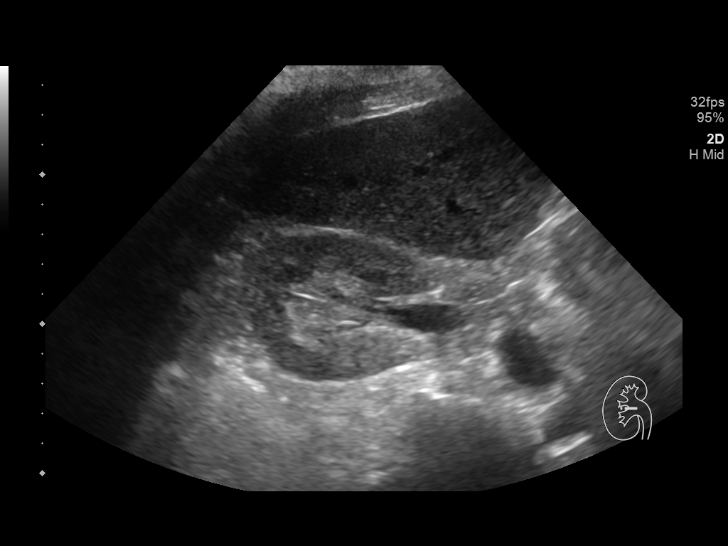
[im 16/43]
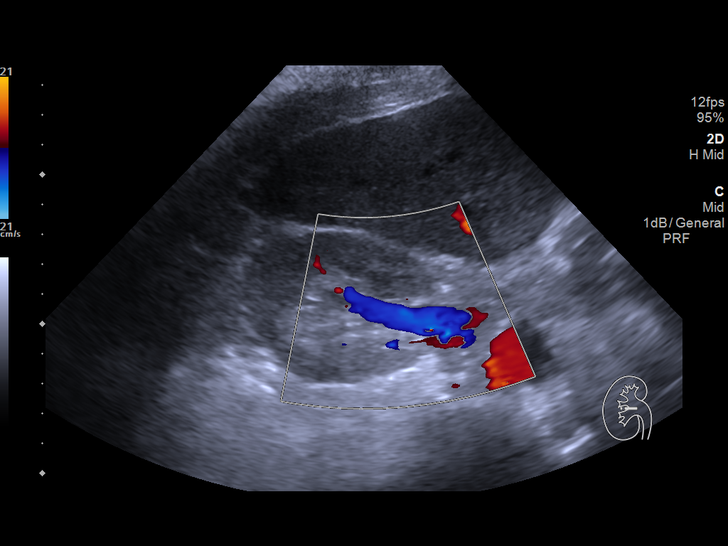
[im 20/43]
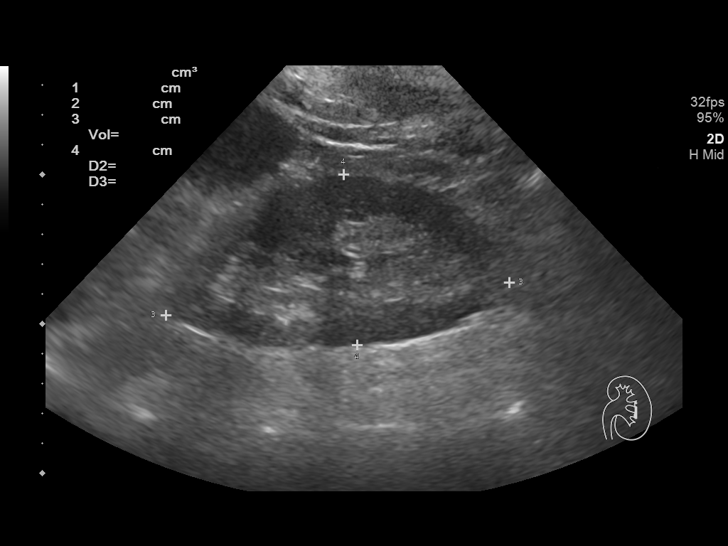
[im 23/43]
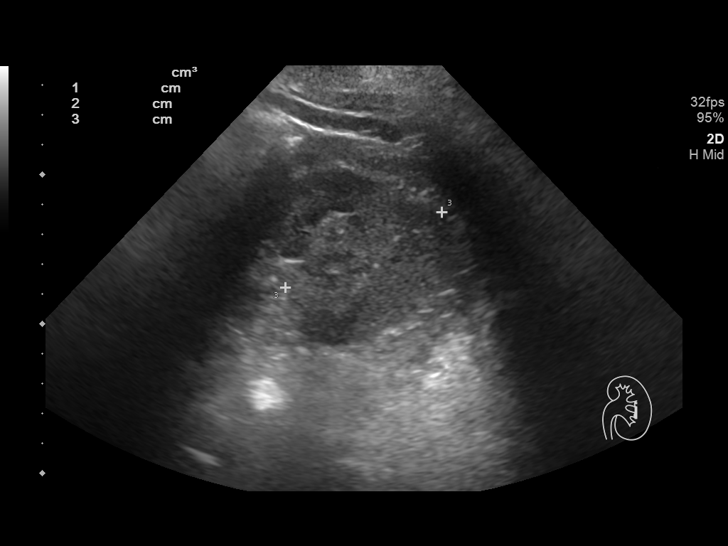
[im 27/43]
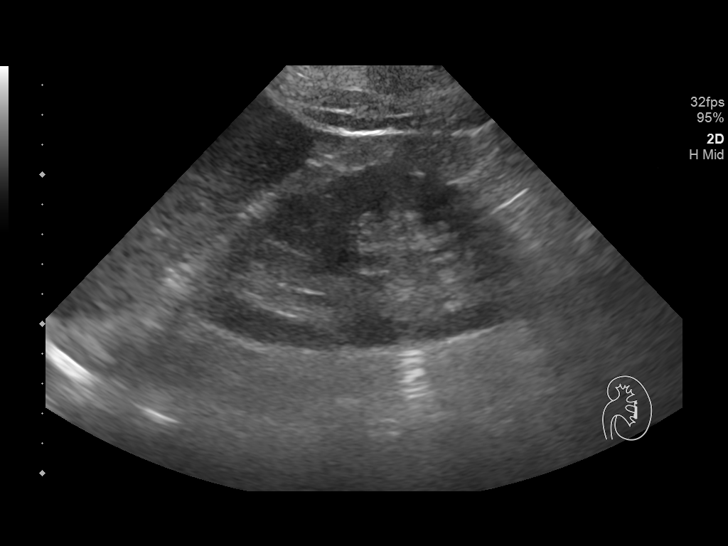
[im 29/43]
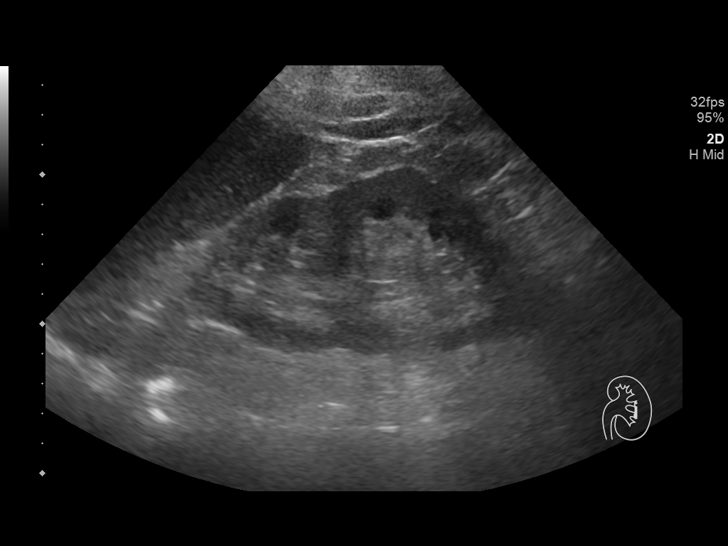
[im 32/43]
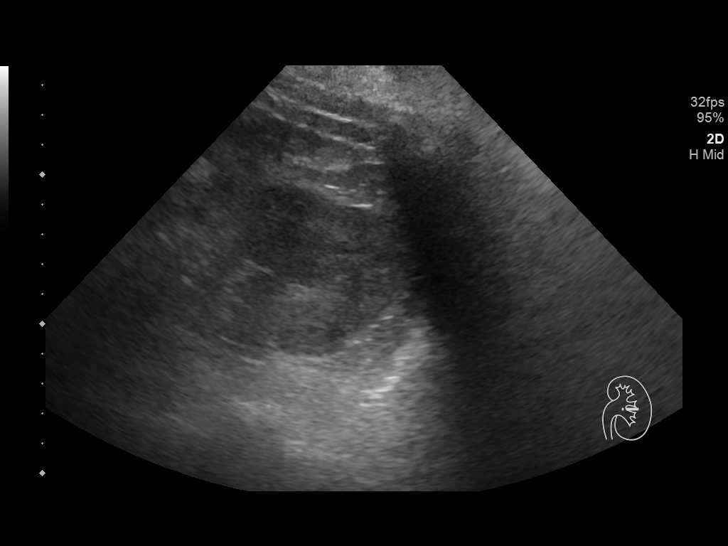
[im 36/43]
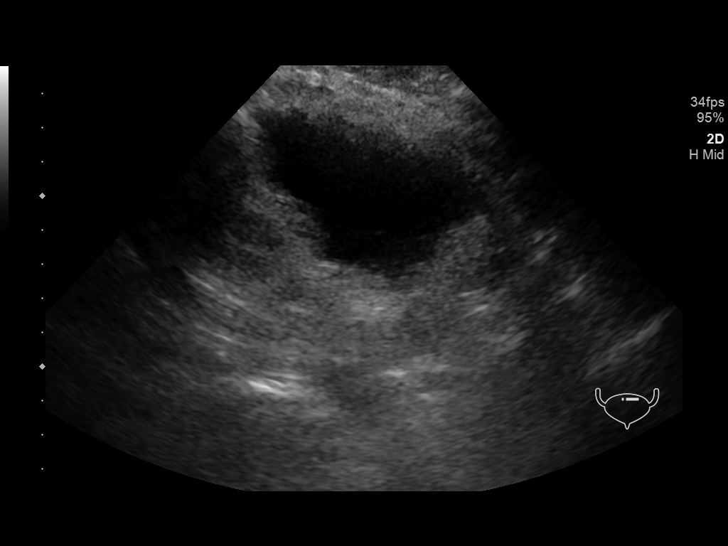
[im 39/43]
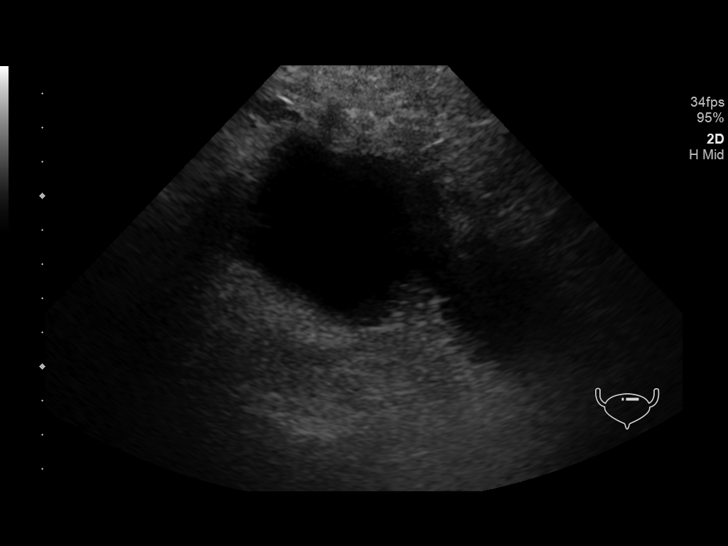
[im 43/43]
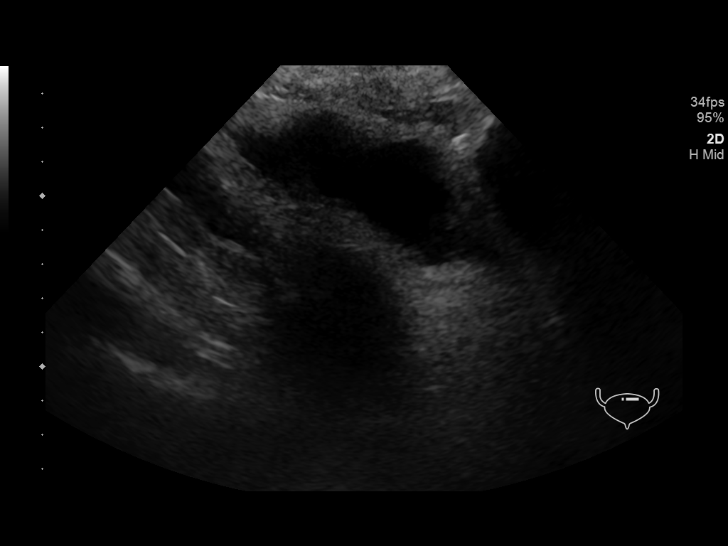

[14 of 25 positions shown; findings below may reference images not displayed]

FINDINGS: Right Kidney:

Renal measurements: 11.0 x 4.6 x 4.6 cm = volume: 125 mL. The renal
cortical echotexture is increased and exceeds that of the liver.
There is no focal mass nor hydronephrosis.

Left Kidney:

Renal measurements: 12.1 x 5.9 x 5.8 cm = volume: 217 mL. The
cortical echotexture of the left kidney is increased similar to that
on the right. There is no focal mass or hydronephrosis.

Bladder:

Appears normal for degree of bladder distention.

Bilateral pleural effusions are observed.
IMPRESSION: Increased renal cortical echotexture compatible with medical renal
disease. No hydronephrosis.

Bilateral pleural effusions are noted.
